# Patient Record
Sex: Female | Born: 2000 | Race: Black or African American | Hispanic: No | Marital: Single | Smoking: Never smoker
Health system: Southern US, Community
[De-identification: ages and names within clinical notes are randomized; demographics above are authoritative.]

## PROBLEM LIST (undated history)

## (undated) ENCOUNTER — Ambulatory Visit (HOSPITAL_COMMUNITY): Admission: EM | Payer: Medicaid Other

## (undated) DIAGNOSIS — Z789 Other specified health status: Secondary | ICD-10-CM

## (undated) HISTORY — PX: NO PAST SURGERIES: SHX2092

---
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
## 2022-02-07 ENCOUNTER — Inpatient Hospital Stay (HOSPITAL_COMMUNITY): Payer: Medicaid Other

## 2022-02-07 ENCOUNTER — Encounter (HOSPITAL_COMMUNITY): Payer: Self-pay

## 2022-02-07 ENCOUNTER — Other Ambulatory Visit: Payer: Self-pay

## 2022-02-07 ENCOUNTER — Inpatient Hospital Stay (HOSPITAL_COMMUNITY)
Admission: AD | Admit: 2022-02-07 | Discharge: 2022-02-07 | Disposition: A | Discharge status: Home / Self Care | Payer: Medicaid Other | Attending: Obstetrics & Gynecology | Admitting: Obstetrics & Gynecology

## 2022-02-07 DIAGNOSIS — R112 Nausea with vomiting, unspecified: Secondary | ICD-10-CM

## 2022-02-07 DIAGNOSIS — Z3A08 8 weeks gestation of pregnancy: Secondary | ICD-10-CM | POA: Diagnosis not present

## 2022-02-07 DIAGNOSIS — O208 Other hemorrhage in early pregnancy: Secondary | ICD-10-CM | POA: Diagnosis not present

## 2022-02-07 DIAGNOSIS — R109 Unspecified abdominal pain: Secondary | ICD-10-CM | POA: Insufficient documentation

## 2022-02-07 DIAGNOSIS — O219 Vomiting of pregnancy, unspecified: Secondary | ICD-10-CM | POA: Diagnosis present

## 2022-02-07 DIAGNOSIS — B999 Unspecified infectious disease: Secondary | ICD-10-CM | POA: Diagnosis not present

## 2022-02-07 DIAGNOSIS — O26891 Other specified pregnancy related conditions, first trimester: Secondary | ICD-10-CM | POA: Diagnosis not present

## 2022-02-07 DIAGNOSIS — Z3491 Encounter for supervision of normal pregnancy, unspecified, first trimester: Secondary | ICD-10-CM

## 2022-02-07 DIAGNOSIS — O2341 Unspecified infection of urinary tract in pregnancy, first trimester: Secondary | ICD-10-CM | POA: Diagnosis not present

## 2022-02-07 DIAGNOSIS — O418X10 Other specified disorders of amniotic fluid and membranes, first trimester, not applicable or unspecified: Secondary | ICD-10-CM

## 2022-02-07 LAB — URINALYSIS, ROUTINE W REFLEX MICROSCOPIC
Bilirubin Urine: NEGATIVE
Glucose, UA: NEGATIVE mg/dL
Hgb urine dipstick: NEGATIVE
Ketones, ur: NEGATIVE mg/dL
Leukocytes,Ua: NEGATIVE
Nitrite: POSITIVE — AB
Protein, ur: NEGATIVE mg/dL
Specific Gravity, Urine: 1.017 (ref 1.005–1.030)
pH: 8 (ref 5.0–8.0)

## 2022-02-07 LAB — CBC
HCT: 37 % (ref 36.0–46.0)
Hemoglobin: 12 g/dL (ref 12.0–15.0)
MCH: 30.2 pg (ref 26.0–34.0)
MCHC: 32.4 g/dL (ref 30.0–36.0)
MCV: 93.2 fL (ref 80.0–100.0)
Platelets: 298 10*3/uL (ref 150–400)
RBC: 3.97 MIL/uL (ref 3.87–5.11)
RDW: 12 % (ref 11.5–15.5)
WBC: 8.4 10*3/uL (ref 4.0–10.5)
nRBC: 0 % (ref 0.0–0.2)

## 2022-02-07 LAB — POCT PREGNANCY, URINE: Preg Test, Ur: POSITIVE — AB

## 2022-02-07 LAB — WET PREP, GENITAL
Clue Cells Wet Prep HPF POC: NONE SEEN
Sperm: NONE SEEN
Trich, Wet Prep: NONE SEEN
WBC, Wet Prep HPF POC: <10
Yeast Wet Prep HPF POC: NONE SEEN

## 2022-02-07 LAB — ABO/RH: ABO/RH(D): A POS

## 2022-02-07 LAB — HCG, QUANTITATIVE, PREGNANCY: hCG, Beta Chain, Quant, S: 161712 m[IU]/mL — ABNORMAL HIGH

## 2022-02-07 MED ORDER — CEPHALEXIN 500 MG PO CAPS: 500.0000 mg | ORAL_CAPSULE | Freq: Four times a day (QID) | ORAL | 0 refills | Status: DC

## 2022-02-07 NOTE — MAU Provider Note (Signed)
Event Date/Time  First Provider Initiated Contact with Patient 02/07/22 1432    Amy Nguyen , a  21 y.o. G1P0 at [redacted]w[redacted]d presents to MAU after 4 episodes of vomiting today. Patient states she woke up vomiting yellow with "dots of blood". She attempted Ginger Ale with no relief. Has not thrown up since arrival to MAU. She also endorses intermittent lower abdominal cramping and back pain rating pain 5/10. Not currently experiencing pain, but "felt it this morning." Denies vaginal bleeding, painful urination, and abnormal discharge.  History     CSN: [id number]  Arrival date and time: 02/07/22 1322   Event Date/Time   First Provider Initiated Contact with Patient 02/07/22 1432      Chief Complaint  Patient presents with   Emesis    OB History     Gravida  1   Para      Term      Preterm      AB      Living         SAB      IAB      Ectopic      Multiple      Live Births              History reviewed. No pertinent past medical history.  History reviewed. No pertinent surgical history.  Family History  Problem Relation Age of Onset   Diabetes Mother     Social History   Tobacco Use   Smoking status: Never   Smokeless tobacco: Never  Vaping Use   Vaping Use: Some days   Substances: Nicotine, Flavoring  Substance Use Topics   Alcohol use: Never   Drug use: Never    Allergies: No Known Allergies  Medications Prior to Admission  Medication Sig Dispense Refill Last Dose   metroNIDAZOLE (FLAGYL) 500 MG tablet Take 1 tablet (500 mg total) by mouth 2 (two) times daily. 14 tablet 0    nitrofurantoin, macrocrystal-monohydrate, (MACROBID) 100 MG capsule Take 1 capsule (100 mg total) by mouth 2 (two) times daily. 10 capsule 0     Review of Systems  Constitutional:  Negative for chills and fever.  HENT:  Negative for sore throat.   Gastrointestinal:  Positive for abdominal pain, nausea and vomiting. Negative for constipation and diarrhea.   Genitourinary:  Positive for pelvic pain. Negative for difficulty urinating, flank pain, urgency, vaginal bleeding, vaginal discharge and vaginal pain.   Physical Exam   Blood pressure (!) 118/95, pulse 86, temperature 98.4 F (36.9 C), resp. rate 12, last menstrual period 12/10/2021, SpO2 99 %.  Physical Exam Vitals and nursing note reviewed.  Constitutional:      General: She is not in acute distress.    Appearance: She is normal weight.  HENT:     Head: Normocephalic.  Pulmonary:     Effort: Pulmonary effort is normal. No respiratory distress.  Abdominal:     Palpations: Abdomen is soft.     Tenderness: There is abdominal tenderness. There is no right CVA tenderness, left CVA tenderness or guarding.     Comments: Pregnant   Musculoskeletal:     Cervical back: Normal range of motion.  Skin:    General: Skin is warm and dry.  Neurological:     Mental Status: She is alert and oriented to person, place, and time.  Psychiatric:        Mood and Affect: Mood normal.     MAU Course  Procedures Orders Placed  This Encounter  Procedures   Wet prep, genital   US OB LESS THAN 14 WEEKS WITH OB TRANSVAGINAL   US OB LESS THAN 14 WEEKS WITH OB TRANSVAGINAL   Urinalysis, Routine w reflex microscopic Urine, Clean Catch   CBC   hCG, quantitative, pregnancy   Diet NPO time specified   Pregnancy, urine POC   ABO/Rh   Discharge patient Discharge disposition: 01-Home or Self Care; Discharge patient date: 02/07/2022   Results for orders placed or performed during the hospital encounter of 02/07/22 (from the past 24 hour(s))  Pregnancy, urine POC     Status: Abnormal   Collection Time: 02/07/22  2:12 PM  Result Value Ref Range   Preg Test, Ur POSITIVE (A) NEGATIVE  Urinalysis, Routine w reflex microscopic     Status: Abnormal   Collection Time: 02/07/22  2:25 PM  Result Value Ref Range   Color, Urine YELLOW YELLOW   APPearance HAZY (A) CLEAR   Specific Gravity, Urine 1.017 1.005 -  1.030   pH 8.0 5.0 - 8.0   Glucose, UA NEGATIVE NEGATIVE mg/dL   Hgb urine dipstick NEGATIVE NEGATIVE   Bilirubin Urine NEGATIVE NEGATIVE   Ketones, ur NEGATIVE NEGATIVE mg/dL   Protein, ur NEGATIVE NEGATIVE mg/dL   Nitrite POSITIVE (A) NEGATIVE   Leukocytes,Ua NEGATIVE NEGATIVE   RBC / HPF 0-5 0 - 5 RBC/hpf   WBC, UA 6-10 0 - 5 WBC/hpf   Bacteria, UA MANY (A) NONE SEEN   Squamous Epithelial / LPF 0-5 0 - 5   Mucus PRESENT   CBC     Status: None   Collection Time: 02/07/22  3:00 PM  Result Value Ref Range   WBC 8.4 4.0 - 10.5 K/uL   RBC 3.97 3.87 - 5.11 MIL/uL   Hemoglobin 12.0 12.0 - 15.0 g/dL   HCT 38.7 56.4 - 33.2 %   MCV 93.2 80.0 - 100.0 fL   MCH 30.2 26.0 - 34.0 pg   MCHC 32.4 30.0 - 36.0 g/dL   RDW 95.1 88.4 - 16.6 %   Platelets 298 150 - 400 K/uL   nRBC 0.0 0.0 - 0.2 %  ABO/Rh     Status: None   Collection Time: 02/07/22  3:00 PM  Result Value Ref Range   ABO/RH(D) A POS    No rh immune globuloin      NOT A RH IMMUNE GLOBULIN CANDIDATE, PT RH POSITIVE Performed at [facility], [address] [place], [place] [address]   hCG, quantitative, pregnancy     Status: Abnormal   Collection Time: 02/07/22  3:00 PM  Result Value Ref Range   hCG, Beta Chain, Quant, S 161,712 (H) <5 mIU/mL  Wet prep, genital     Status: None   Collection Time: 02/07/22  3:24 PM   Specimen: PATH Cytology Cervicovaginal Ancillary Only  Result Value Ref Range   Yeast Wet Prep HPF POC NONE SEEN NONE SEEN   Trich, Wet Prep NONE SEEN NONE SEEN   Clue Cells Wet Prep HPF POC NONE SEEN NONE SEEN   WBC, Wet Prep HPF POC <10 <10   Sperm NONE SEEN    US OB LESS THAN 14 WEEKS WITH OB TRANSVAGINAL  Result Date: 02/07/2022 CLINICAL DATA:  Abdominal pain. EXAM: OBSTETRIC <14 WK [place] AND TRANSVAGINAL OB US TECHNIQUE: Both transabdominal and transvaginal ultrasound examinations were performed for complete evaluation of the gestation as well as the maternal uterus, adnexal regions, and pelvic  cul-de-sac. Transvaginal technique  was performed to assess early pregnancy. COMPARISON:  None Available. FINDINGS: Intrauterine gestational sac: Single Yolk sac:  Visualized. Embryo:  Visualized. Cardiac Activity: Visualized. Heart Rate: 160 bpm CRL:  15.4 mm   7 w   6 d                  [place] EDC: 09/20/2022 Subchorionic hemorrhage: Small subchorionic hemorrhage identified. The chorion and amnion appear separated which is within normal limits for a gestation this age; however, there is some debris identified in this space. Maternal uterus/adnexae: Bilateral ovaries appear within normal limits. There is a corpus luteum in the left ovary. Trace free fluid in the pelvis. IMPRESSION: 1. Single live intrauterine gestation measuring 7 weeks 6 days by crown-rump length. 2. Small subchorionic hemorrhage. 3. Debris identified within the space between the chorion and amnion. This finding is indeterminate. Short-term follow-up ultrasound suggested to re-evaluate. Electronically Signed   By: Darliss Cheney M.D.   On: 02/07/2022 16:30    MDM UA positive for nitrites, indicative UTI. All other labs normal.  Normal IUP noted ~[redacted]w[redacted]d. Small Subchorionic hemorrhage noted on Ultrasound. Debris noted between Chorion and Amnion. Recommendation for follow-up Ultrasound for re-evaluation.   Assessment and Plan   1. UTI (urinary tract infection) in pregnancy in first trimester   2. [redacted] weeks gestation of pregnancy   3. Abdominal pain during pregnancy in first trimester   4. Mild nausea and vomiting   5. Normal IUP (intrauterine pregnancy) on prenatal ultrasound, first trimester   6. Subchorionic hematoma in first trimester, single or unspecified fetus    - UTI noted on UA. PO Keflex sent to Outpatient pharmacy. Admin instructions provided at bedside.  - Normal IUP with small subchorionic hemorrhage. Bleeding expectations associated with Subchorionic Hem discussed with patient.  - Message sent to [facility] to schedule follow up US for  Debris between Chorion and Amnion.  - Early Pregnancy return precautions discussed. - Patient discharged home in stable condition.  - Patient may return to MAU as needed.   Claudette Head 02/07/2022, 2:32 PM

## 2022-02-07 NOTE — MAU Note (Addendum)
Amy Nguyen is a 21 y.o. at Unknown here in MAU reporting: Pt reports that she has been throwing up throughout her pregnancy. Pt reports she woke up and has been throwing up yellow and some blood with a bad taste. Pt reports feeling light headed.  LMP: 12/10/2021 Onset of complaint: today  Pain score: 5/10 lower abdominal cramping  There were no vitals filed for this visit.    Lab orders placed from triage:  ua

## 2022-02-08 LAB — GC/CHLAMYDIA PROBE AMP (~~LOC~~) NOT AT ARMC
Chlamydia: NEGATIVE
Comment: NEGATIVE
Comment: NORMAL
Neisseria Gonorrhea: NEGATIVE

## 2022-02-11 ENCOUNTER — Telehealth: Payer: Self-pay

## 2022-02-11 DIAGNOSIS — O468X1 Other antepartum hemorrhage, first trimester: Secondary | ICD-10-CM

## 2022-02-11 NOTE — Telephone Encounter (Signed)
-----   Message from Carlynn Herald, [place] sent at 02/07/2022  5:00 PM EDT ----- Regarding: PP Message Hey this patient needs a follow up US. Please call patient to schedule. Thank you!    Called pt with new appt time; partner answers phone and states patient is asleep. Explained I will send MyChart message to patient with details of new appointment.

## 2022-02-21 ENCOUNTER — Ambulatory Visit
Admission: RE | Admit: 2022-02-21 | Discharge: 2022-02-21 | Disposition: A | Discharge status: Home / Self Care | Payer: Medicaid Other | Source: Ambulatory Visit | Attending: Certified Nurse Midwife | Admitting: Certified Nurse Midwife

## 2022-02-21 ENCOUNTER — Encounter (HOSPITAL_COMMUNITY): Payer: Self-pay | Admitting: Emergency Medicine

## 2022-02-21 ENCOUNTER — Ambulatory Visit (HOSPITAL_COMMUNITY)
Admission: EM | Admit: 2022-02-21 | Discharge: 2022-02-21 | Disposition: A | Discharge status: Home / Self Care | Payer: Medicaid Other | Attending: Internal Medicine | Admitting: Internal Medicine

## 2022-02-21 DIAGNOSIS — O418X10 Other specified disorders of amniotic fluid and membranes, first trimester, not applicable or unspecified: Secondary | ICD-10-CM | POA: Insufficient documentation

## 2022-02-21 DIAGNOSIS — Z202 Contact with and (suspected) exposure to infections with a predominantly sexual mode of transmission: Secondary | ICD-10-CM | POA: Diagnosis not present

## 2022-02-21 DIAGNOSIS — O468X1 Other antepartum hemorrhage, first trimester: Secondary | ICD-10-CM | POA: Insufficient documentation

## 2022-02-21 DIAGNOSIS — R21 Rash and other nonspecific skin eruption: Secondary | ICD-10-CM | POA: Insufficient documentation

## 2022-02-21 DIAGNOSIS — Z113 Encounter for screening for infections with a predominantly sexual mode of transmission: Secondary | ICD-10-CM | POA: Insufficient documentation

## 2022-02-21 MED ORDER — CEFTRIAXONE SODIUM 500 MG IJ SOLR
500.0000 mg | Freq: Once | INTRAMUSCULAR | Status: AC
  Administered 2022-02-21: 500 mg via INTRAMUSCULAR

## 2022-02-21 MED ORDER — LIDOCAINE HCL (PF) 1 % IJ SOLN
INTRAMUSCULAR | Status: AC
  Filled 2022-02-21: qty 2

## 2022-02-21 MED ORDER — NYSTATIN 100000 UNIT/GM EX CREA: TOPICAL_CREAM | CUTANEOUS | 0 refills | Status: DC

## 2022-02-21 MED ORDER — CEFTRIAXONE SODIUM 500 MG IJ SOLR
INTRAMUSCULAR | Status: AC
  Filled 2022-02-21: qty 500

## 2022-02-21 NOTE — Discharge Instructions (Signed)
Apply nystatin cream to your genital rash 2-3 times daily until the rash improves and goes away (likely approximately 7 days).  We gave you treatment for gonorrhea today due to your positive exposure.  The rest of your STI testing will come back in the next 2 to 3 days.  We will give you a phone call if you are any of your results are positive requiring further treatment.  Avoid sexual intercourse for the next week to prevent spread of STI.  Use condoms to further prevent spread of STIs in the future.   Follow-up with your OB/GYN as directed for further pregnancy care.  If you develop any new or worsening symptoms or do not improve in the next 2 to 3 days, please return.  If your symptoms are severe, please go to the emergency room.  Follow-up with your primary care provider for further evaluation and management of your symptoms as well as ongoing wellness visits.  I hope you feel better!

## 2022-02-21 NOTE — ED Triage Notes (Signed)
Patient c/o rash that started today.   Patient endorses rash is present on groin area.   Patient denies itching, burning, or pain.   Patient denies dysuria   Patient is [redacted] weeks pregnant.

## 2022-02-21 NOTE — ED Provider Notes (Signed)
[facility]    CSN: [id number] Arrival date & time: 02/21/22  1731      History   Chief Complaint Chief Complaint  Patient presents with   Rash    HPI Amy Nguyen is a 21 y.o. female.   Patient presents to urgent care for evaluation of her vaginal rash that she first noticed a couple of days ago when she woke up.  Her boyfriend recently tested positive for gonorrhea.  She had sexual intercourse with him 2 days ago and he found out that he had gonorrhea yesterday.  She denies vaginal odor and vaginal itching.  She does report a small amount of clear/white vaginal discharge.  She is currently [redacted] weeks pregnant and has had her first OB/GYN appointment.  She was tested for STIs 2 weeks ago and tested negative for everything.  She was diagnosed with a urinary tract infection when she was tested for STIs 2 weeks ago and treated with antibiotics successfully.  She denies urinary frequency, dysuria, and urgency at this time.  No fever/chills reported.  No abdominal pain, diarrhea, constipation, dizziness, weakness, vaginal bleeding, or low back pain.    Rash   History reviewed. No pertinent past medical history.  There are no problems to display for this patient.   History reviewed. No pertinent surgical history.  OB History     Gravida  1   Para      Term      Preterm      AB      Living         SAB      IAB      Ectopic      Multiple      Live Births               Home Medications    Prior to Admission medications   Medication Sig Start Date End Date Taking? Authorizing Provider  nystatin cream (MYCOSTATIN) Apply to affected area 2 times daily 02/21/22  Yes Carlisle Beers, FNP  esomeprazole (NEXIUM) 20 MG capsule Take 1 capsule (20 mg total) by mouth daily before breakfast. Patient not taking: No sig reported 06/23/19 01/09/21  Wallis Bamberg, PA-C  famotidine (PEPCID) 20 MG tablet Take 1 tablet (20 mg total) by mouth 2 (two) times  daily. Patient not taking: No sig reported 06/23/19 01/09/21  Wallis Bamberg, PA-C    Family History Family History  Problem Relation Age of Onset   Diabetes Mother     Social History Social History   Tobacco Use   Smoking status: Never   Smokeless tobacco: Never  Vaping Use   Vaping Use: Some days   Substances: Nicotine, Flavoring  Substance Use Topics   Alcohol use: Never   Drug use: Never     Allergies   Patient has no known allergies.   Review of Systems Review of Systems  Skin:  Positive for rash.  Per HPI   Physical Exam Triage Vital Signs ED Triage Vitals  Enc Vitals Group     BP 02/21/22 1818 115/75     Pulse Rate 02/21/22 1818 82     Resp 02/21/22 1818 18     Temp 02/21/22 1818 98.8 F (37.1 C)     Temp Source 02/21/22 1818 Oral     SpO2 02/21/22 1818 100 %     Weight --      Height --      Head Circumference --  Peak Flow --      Pain Score 02/21/22 1821 0     Pain Loc --      Pain Edu? --      Excl. in [place]? --    No data found.  Updated Vital Signs BP 115/75 (BP Location: Right Arm)   Pulse 82   Temp 98.8 F (37.1 C) (Oral)   Resp 18   LMP 12/10/2021   SpO2 100%   Visual Acuity Right Eye Distance:   Left Eye Distance:   Bilateral Distance:    Right Eye Near:   Left Eye Near:    Bilateral Near:     Physical Exam Vitals and nursing note reviewed.  Constitutional:      Appearance: Normal appearance. She is not ill-appearing or toxic-appearing.     Comments: Very pleasant patient sitting on exam in position of comfort table in no acute distress.   HENT:     Head: Normocephalic and atraumatic.     Right Ear: Hearing and external ear normal.     Left Ear: Hearing and external ear normal.     Nose: Nose normal.     Mouth/Throat:     Lips: Pink.     Mouth: Mucous membranes are moist.  Eyes:     General: Lids are normal. Vision grossly intact. Gaze aligned appropriately.     Extraocular Movements: Extraocular movements intact.      Conjunctiva/sclera: Conjunctivae normal.  Pulmonary:     Effort: Pulmonary effort is normal.  Abdominal:     Palpations: Abdomen is soft.  Genitourinary:    Pubic Area: Rash present.     Comments: Maculupapular skin colored rash that is slightly erythematous inferior to the vagina that extends slightly to the rectum and into the gluteal cleft that appears to be fungal in nature.  Patient denies pain and itching to the rash.  No drainage present. No vaginal discharge noted with inspection of external vaginal canal. Labia majora and minora appear normal and are not swollen. No lesions to the labia majora and minora/vaginal canal. No blood visualized.  Musculoskeletal:     Cervical back: Neck supple.  Skin:    General: Skin is warm and dry.     Capillary Refill: Capillary refill takes less than 2 seconds.     Findings: No rash.  Neurological:     General: No focal deficit present.     Mental Status: She is alert and oriented to person, place, and time. Mental status is at baseline.     Cranial Nerves: No dysarthria or facial asymmetry.     Gait: Gait is intact.  Psychiatric:        Mood and Affect: Mood normal.        Speech: Speech normal.        Behavior: Behavior normal.        Thought Content: Thought content normal.        Judgment: Judgment normal.      UC Treatments / Results  Labs (all labs ordered are listed, but only abnormal results are displayed) Labs Reviewed - No data to display  EKG   Radiology No results found.  Procedures Procedures (including critical care time)  Medications Ordered in UC Medications  cefTRIAXone (ROCEPHIN) injection 500 mg (500 mg Intramuscular Given 02/21/22 1940)    Initial Impression / Assessment and Plan / UC Course  I have reviewed the triage vital signs and the nursing notes.  Pertinent labs & imaging results that  were available during my care of the patient were reviewed by me and considered in my medical decision making  (see chart for details).  1.  Exposure to gonorrhea Ceftriaxone 500 mg injection given in clinic due to known exposure to gonorrhea.  STI testing is pending.  Plan to treat per protocol for other infections present.  Patient to abstain from sexual intercourse for 1 week to avoid spread of STI.  Encouraged condom use to prevent future spread of STIs.  2.  Rash of genital area Rash appears fungal in nature.  Patient to apply nystatin cream twice daily until the rash resolves (likely 7 days).  This is safe in pregnancy.  Patient to follow-up with OB/GYN for further evaluation and management of genital rash as well as all other pregnancy related concerns.  She is without abdominal pain and vaginal bleeding at this time.  Discussed physical exam and available lab work findings in clinic with patient.  Counseled patient regarding appropriate use of medications and potential side effects for all medications recommended or prescribed today. Discussed red flag signs and symptoms of worsening condition,when to call the PCP office, return to urgent care, and when to seek higher level of care in the emergency department. Patient verbalizes understanding and agreement with plan. All questions answered. Patient discharged in stable condition.  Final Clinical Impressions(s) / UC Diagnoses   Final diagnoses:  Exposure to gonorrhea  Screening examination for sexually transmitted disease  Rash of genital area     Discharge Instructions      Apply nystatin cream to your genital rash 2-3 times daily until the rash improves and goes away (likely approximately 7 days).  We gave you treatment for gonorrhea today due to your positive exposure.  The rest of your STI testing will come back in the next 2 to 3 days.  We will give you a phone call if you are any of your results are positive requiring further treatment.  Avoid sexual intercourse for the next week to prevent spread of STI.  Use condoms to further prevent  spread of STIs in the future.   Follow-up with your OB/GYN as directed for further pregnancy care.  If you develop any new or worsening symptoms or do not improve in the next 2 to 3 days, please return.  If your symptoms are severe, please go to the emergency room.  Follow-up with your primary care provider for further evaluation and management of your symptoms as well as ongoing wellness visits.  I hope you feel better!     ED Prescriptions     Medication Sig Dispense Auth. Provider   nystatin cream (MYCOSTATIN) Apply to affected area 2 times daily 30 g Carlisle Beers, FNP      PDMP not reviewed this encounter.   Carlisle Beers, [place] 02/24/22 2029

## 2022-02-22 ENCOUNTER — Telehealth (HOSPITAL_COMMUNITY): Payer: Self-pay | Admitting: Emergency Medicine

## 2022-02-22 LAB — CERVICOVAGINAL ANCILLARY ONLY
Bacterial Vaginitis (gardnerella): NEGATIVE
Candida Glabrata: NEGATIVE
Candida Vaginitis: POSITIVE — AB
Chlamydia: NEGATIVE
Comment: NEGATIVE
Comment: NEGATIVE
Comment: NEGATIVE
Comment: NEGATIVE
Comment: NEGATIVE
Comment: NORMAL
Neisseria Gonorrhea: NEGATIVE
Trichomonas: NEGATIVE

## 2022-02-22 MED ORDER — CLOTRIMAZOLE 1 % VA CREA: 1.0000 | TOPICAL_CREAM | Freq: Every day | VAGINAL | 0 refills | Status: DC

## 2022-02-28 ENCOUNTER — Encounter (HOSPITAL_BASED_OUTPATIENT_CLINIC_OR_DEPARTMENT_OTHER): Payer: Medicaid Other

## 2022-02-28 ENCOUNTER — Encounter (HOSPITAL_BASED_OUTPATIENT_CLINIC_OR_DEPARTMENT_OTHER): Payer: Medicaid Other | Admitting: Obstetrics & Gynecology

## 2022-02-28 ENCOUNTER — Telehealth (HOSPITAL_BASED_OUTPATIENT_CLINIC_OR_DEPARTMENT_OTHER): Payer: Self-pay | Admitting: *Deleted

## 2022-02-28 NOTE — Telephone Encounter (Signed)
Called pt to let her know that she missed her new OB appt today. Pt states that she had a situation where her tire was flat and she would be unable to make it. Pt rescheduled for next available appt on 03/26/22.

## 2022-03-01 ENCOUNTER — Encounter (HOSPITAL_BASED_OUTPATIENT_CLINIC_OR_DEPARTMENT_OTHER): Payer: Medicaid Other | Admitting: Medical

## 2022-03-09 ENCOUNTER — Ambulatory Visit (HOSPITAL_COMMUNITY)
Admission: EM | Admit: 2022-03-09 | Discharge: 2022-03-09 | Disposition: A | Discharge status: Home / Self Care | Payer: Medicaid Other | Attending: Emergency Medicine | Admitting: Emergency Medicine

## 2022-03-09 ENCOUNTER — Encounter (HOSPITAL_COMMUNITY): Payer: Self-pay | Admitting: Emergency Medicine

## 2022-03-09 DIAGNOSIS — L02412 Cutaneous abscess of left axilla: Secondary | ICD-10-CM | POA: Diagnosis not present

## 2022-03-09 MED ORDER — CEPHALEXIN 500 MG PO CAPS: 500.0000 mg | ORAL_CAPSULE | Freq: Two times a day (BID) | ORAL | 0 refills | Status: AC

## 2022-03-09 NOTE — Discharge Instructions (Addendum)
Please take medication as prescribed. Avoid shaving the area for the next two weeks. You can apply warm compress to help with pain, swelling, and drainage. You can take tylenol for pain, but avoid any medicines like ibuprofen/Advil, Aleve, naproxen as these are not safe in early pregnancy.  Follow up with your OB/Gyn.  Please go to the emergency department if symptoms worsen.

## 2022-03-09 NOTE — ED Triage Notes (Signed)
Pt reports bump/knot in left axilla that has gotten more painful over the past week after shaving arm pit area. Reports when woke up today was draining so continued to squeeze and get greenish fluid out. Reports has another area that is hard and painful

## 2022-03-09 NOTE — ED Provider Notes (Signed)
[facility]    CSN: [id number] Arrival date & time: 03/09/22  1316     History   Chief Complaint Chief Complaint  Patient presents with   Abscess    HPI Amy Nguyen is a 21 y.o. female.  Presents with 4-day history of lump in the left armpit.  Reports it opened and started draining yesterday.  A new lump seems to be appearing just underneath it.  She has no history of abscess in this area.  Originally thought it was from shaving. No fever/chills, warmth, redness, abd pain, vomiting/diarrhea. Applying warm compress.  Currently [redacted] weeks pregnant.  History reviewed. No pertinent past medical history.  There are no problems to display for this patient.  History reviewed. No pertinent surgical history.  OB History     Gravida  1   Para      Term      Preterm      AB      Living         SAB      IAB      Ectopic      Multiple      Live Births               Home Medications    Prior to Admission medications   Medication Sig Start Date End Date Taking? Authorizing Provider  cephALEXin (KEFLEX) 500 MG capsule Take 1 capsule (500 mg total) by mouth 2 (two) times daily for 5 days. 03/09/22 03/14/22 Yes Elmina Hendel, Lurena Joiner, PA-C  esomeprazole (NEXIUM) 20 MG capsule Take 1 capsule (20 mg total) by mouth daily before breakfast. Patient not taking: No sig reported 06/23/19 01/09/21  Wallis Bamberg, PA-C  famotidine (PEPCID) 20 MG tablet Take 1 tablet (20 mg total) by mouth 2 (two) times daily. Patient not taking: No sig reported 06/23/19 01/09/21  Wallis Bamberg, PA-C    Family History Family History  Problem Relation Age of Onset   Diabetes Mother     Social History Social History   Tobacco Use   Smoking status: Never   Smokeless tobacco: Never  Vaping Use   Vaping Use: Some days   Substances: Nicotine, Flavoring  Substance Use Topics   Alcohol use: Never   Drug use: Never     Allergies   Patient has no known allergies.   Review of  Systems Review of Systems Per HPI  Physical Exam Triage Vital Signs ED Triage Vitals  Enc Vitals Group     BP 03/09/22 1341 120/68     Pulse Rate 03/09/22 1341 92     Resp 03/09/22 1344 16     Temp 03/09/22 1341 98.7 F (37.1 C)     Temp Source 03/09/22 1341 Oral     SpO2 03/09/22 1341 96 %     Weight --      Height --      Head Circumference --      Peak Flow --      Pain Score 03/09/22 1339 6     Pain Loc --      Pain Edu? --      Excl. in [place]? --    No data found.  Updated Vital Signs BP 120/68 (BP Location: Right Arm)   Pulse 92   Temp 98.7 F (37.1 C) (Oral)   Resp 16   LMP 12/10/2021   SpO2 96%    Physical Exam Vitals and nursing note reviewed.  Constitutional:  General: She is not in acute distress. HENT:     Mouth/Throat:     Pharynx: Oropharynx is clear.  Eyes:     Conjunctiva/sclera: Conjunctivae normal.  Cardiovascular:     Rate and Rhythm: Normal rate and regular rhythm.     Pulses: Normal pulses.     Heart sounds: Normal heart sounds.  Pulmonary:     Effort: Pulmonary effort is normal.     Breath sounds: Normal breath sounds.  Musculoskeletal:        General: Normal range of motion.  Skin:    Findings: Abscess present.     Comments: Half centimeter, draining area of the left armpit.  Very small firm area below it.  No erythema or warmth  Neurological:     Mental Status: She is alert and oriented to person, place, and time.     UC Treatments / Results  Labs (all labs ordered are listed, but only abnormal results are displayed) Labs Reviewed - No data to display  EKG   Radiology No results found.  Procedures Procedures  Medications Ordered in UC Medications - No data to display  Initial Impression / Assessment and Plan / UC Course  I have reviewed the triage vital signs and the nursing notes.  Pertinent labs & imaging results that were available during my care of the patient were reviewed by me and considered in my  medical decision making (see chart for details).  One area draining, other area firm and not ready to be drained. Keflex twice daily for 5 days.  Follow-up with OB/GYN.  Warm compress, Tylenol for any pain.  Avoid shaving the area.  Patient agrees to plan and she is discharged stable condition.  Final Clinical Impressions(s) / UC Diagnoses   Final diagnoses:  Abscess of left axilla     Discharge Instructions      Please take medication as prescribed. Avoid shaving the area for the next two weeks. You can apply warm compress to help with pain, swelling, and drainage. You can take tylenol for pain, but avoid any medicines like ibuprofen/Advil, Aleve, naproxen as these are not safe in early pregnancy.  Follow up with your OB/Gyn.  Please go to the emergency department if symptoms worsen.    ED Prescriptions     Medication Sig Dispense Auth. Provider   cephALEXin (KEFLEX) 500 MG capsule Take 1 capsule (500 mg total) by mouth 2 (two) times daily for 5 days. 10 capsule Niara Bunker, Lurena Joiner, PA-C      PDMP not reviewed this encounter.   Antion Andres, [place] 03/09/22 1444

## 2022-03-26 ENCOUNTER — Telehealth (HOSPITAL_BASED_OUTPATIENT_CLINIC_OR_DEPARTMENT_OTHER): Payer: Self-pay | Admitting: Neonatal-Perinatal Medicine

## 2022-03-26 ENCOUNTER — Encounter (HOSPITAL_BASED_OUTPATIENT_CLINIC_OR_DEPARTMENT_OTHER): Payer: Medicaid Other | Admitting: Obstetrics & Gynecology

## 2022-03-26 NOTE — Telephone Encounter (Signed)
Called patient and was not able to leave a message no voice mail was step up.

## 2022-04-01 ENCOUNTER — Inpatient Hospital Stay (HOSPITAL_COMMUNITY)
Admission: AD | Admit: 2022-04-01 | Discharge: 2022-04-01 | Disposition: A | Discharge status: Home / Self Care | Payer: Medicaid Other | Attending: Family Medicine | Admitting: Family Medicine

## 2022-04-01 ENCOUNTER — Encounter (HOSPITAL_COMMUNITY): Payer: Self-pay | Admitting: Family Medicine

## 2022-04-01 ENCOUNTER — Other Ambulatory Visit: Payer: Self-pay

## 2022-04-01 DIAGNOSIS — L02416 Cutaneous abscess of left lower limb: Secondary | ICD-10-CM | POA: Diagnosis not present

## 2022-04-01 DIAGNOSIS — Z3A16 16 weeks gestation of pregnancy: Secondary | ICD-10-CM | POA: Diagnosis not present

## 2022-04-01 DIAGNOSIS — O208 Other hemorrhage in early pregnancy: Secondary | ICD-10-CM | POA: Diagnosis not present

## 2022-04-01 DIAGNOSIS — L02411 Cutaneous abscess of right axilla: Secondary | ICD-10-CM | POA: Diagnosis not present

## 2022-04-01 DIAGNOSIS — O26892 Other specified pregnancy related conditions, second trimester: Secondary | ICD-10-CM | POA: Insufficient documentation

## 2022-04-01 DIAGNOSIS — O418X20 Other specified disorders of amniotic fluid and membranes, second trimester, not applicable or unspecified: Secondary | ICD-10-CM

## 2022-04-01 DIAGNOSIS — L02412 Cutaneous abscess of left axilla: Secondary | ICD-10-CM | POA: Insufficient documentation

## 2022-04-01 DIAGNOSIS — R109 Unspecified abdominal pain: Secondary | ICD-10-CM | POA: Diagnosis present

## 2022-04-01 DIAGNOSIS — L0291 Cutaneous abscess, unspecified: Secondary | ICD-10-CM

## 2022-04-01 HISTORY — DX: Other specified health status: Z78.9

## 2022-04-01 MED ORDER — SULFAMETHOXAZOLE-TRIMETHOPRIM 800-160 MG PO TABS: 1.0000 | ORAL_TABLET | Freq: Two times a day (BID) | ORAL | 1 refills | Status: DC

## 2022-04-01 NOTE — MAU Note (Signed)
Amy Nguyen is a 21 y.o. at [redacted]w[redacted]d here in MAU reporting: abdominal cramping and intermittent spotting.  Reports not spotting currently.  Denies recent intercourse. Took Tylenol @ 0900, no relief noted.  Denies LOF or VB. Also c/o lumps underneath bilateral armpits and inner thighs.  States seen in UC for lumps, given antibiotics, but lumps have remained.   Onset of complaint: today Pain score: 7/10 Vitals:   04/01/22 1611  BP: 110/66  Pulse: (!) 110  Resp: 18  Temp: 98.5 F (36.9 C)  SpO2: 100%     FHT:154 bpm Lab orders placed from triage:   UA

## 2022-04-01 NOTE — MAU Provider Note (Signed)
History     CSN: [id number]  Arrival date and time: 04/01/22 1551   Event Date/Time   First Provider Initiated Contact with Patient 04/01/22 1717      Chief Complaint  Patient presents with   Abdominal Pain   HPI This is a 21 year old G1, P0 at 16 weeks and 0 days who presents with painful lumps in her axilla and on her medial thighs.  She has been having these off-and-on for the past few weeks.  When she develops this, the area swelled up and moving first drain purulent fluid.  No palliating or provoking factors.  She was seen at the urgent care couple weeks ago and was treated with Keflex, which did not help.  Initially, his lip started to hurt axilla bilaterally.  It is only been recently that she has developed on the thighs.  Patient also having some mild cramping with spotting.  She has had this before in June.  OB History     Gravida  1   Para      Term      Preterm      AB      Living         SAB      IAB      Ectopic      Multiple      Live Births              Past Medical History:  Diagnosis Date   Medical history non-contributory     Past Surgical History:  Procedure Laterality Date   NO PAST SURGERIES      Family History  Problem Relation Age of Onset   Diabetes Mother    Hypertension Father     Social History   Tobacco Use   Smoking status: Never   Smokeless tobacco: Never  Vaping Use   Vaping Use: Some days   Substances: Nicotine, Flavoring  Substance Use Topics   Alcohol use: Never   Drug use: Never    Allergies: No Known Allergies  Medications Prior to Admission  Medication Sig Dispense Refill Last Dose   Prenatal Vit-Fe Fumarate-FA (PRENATAL MULTIVITAMIN) TABS tablet Take 1 tablet by mouth daily at 12 noon.   03/31/2022    Review of Systems Physical Exam   Blood pressure 116/65, pulse (!) 104, temperature 98.5 F (36.9 C), temperature source Oral, resp. rate 18, height 5' 7.5" (1.715 m), weight 78.2 kg, last  menstrual period 12/10/2021, SpO2 100 %.  Physical Exam Vitals reviewed.  Constitutional:      Appearance: She is well-developed.  Skin:    Comments: 3 cm firm and tender abscess in the right axilla 2 cm firm and tender abscess in the left axilla 4 cm firm and tender abscess on the left medial thigh  Neurological:     Mental Status: She is alert.     MAU Course  Procedures Pt informed that the ultrasound is considered a limited OB ultrasound and is not intended to be a complete ultrasound exam.  Patient also informed that the ultrasound is not being completed with the intent of assessing for fetal or placental anomalies or any pelvic abnormalities.  Explained that the purpose of today's ultrasound is to assess for  viability.  Patient acknowledges the purpose of the exam and the limitations of the study.    Intrauterine pregnancy with fetal heart rate 140s.  Normal fluid.  Small subchorionic hematoma.  Assessment and Plan   1. [redacted] weeks gestation  of pregnancy   2. Cutaneous abscess, unspecified site   3. Subchorionic hematoma in second trimester, single or unspecified fetus    Will cover for MRSA with Bactrim twice a day for the next 7 days.  At this point there is nothing fluctuant to drain.   Could consider the MRSA decolonization with Hibiclens and Bactroban following treatment.  Levie Heritage 04/01/2022, 5:17 PM

## 2022-04-10 ENCOUNTER — Telehealth (INDEPENDENT_AMBULATORY_CARE_PROVIDER_SITE_OTHER): Payer: Medicaid Other

## 2022-04-10 DIAGNOSIS — Z3402 Encounter for supervision of normal first pregnancy, second trimester: Secondary | ICD-10-CM | POA: Insufficient documentation

## 2022-04-10 DIAGNOSIS — Z3480 Encounter for supervision of other normal pregnancy, unspecified trimester: Secondary | ICD-10-CM

## 2022-04-10 MED ORDER — BLOOD PRESSURE MONITORING DEVI: 1.0000 | 0 refills | Status: DC

## 2022-04-10 NOTE — Progress Notes (Signed)
New OB Intake  I connected with  Amy Nguyen on 04/10/22 at  2:15 PM EDT by MyChart Video Visit and verified that I am speaking with the correct person using two identifiers. Nurse is located at [facility] and pt is located at [facility].  I discussed the limitations, risks, security and privacy concerns of performing an evaluation and management service by telephone and the availability of in person appointments. I also discussed with the patient that there may be a patient responsible charge related to this service. The patient expressed understanding and agreed to proceed.  I explained I am completing New OB Intake today. We discussed her EDD of 09/16/22 that is based on LMP of 12/10/21. Pt is G1/P0. I reviewed her allergies, medications, Medical/Surgical/OB history, and appropriate screenings. I informed her of [facility] services. [facility] information placed in AVS. Based on history, this is a/an  pregnancy uncomplicated .   There are no problems to display for this patient.   Concerns addressed today  Delivery Plans Plans to deliver at [facility]. Patient given information for [facility] Healthy Baby website for more information about [facility]. Patient is not interested in water birth. Offered upcoming OB visit with CNM to discuss further.  MyChart/Babyscripts MyChart access verified. I explained pt will have some visits in office and some virtually. Babyscripts instructions given and order placed. Patient verifies receipt of registration text/e-mail. Account successfully created and app downloaded.  Blood Pressure Cuff/Weight Scale Blood pressure cuff ordered for patient to pick-up from [employer]. Explained after first prenatal appt pt will check weekly and document in Babyscripts. Patient does / does not  have weight scale. Weight scale ordered for patient to pick up from [employer].   Anatomy US Explained first scheduled [place] will be around 19 weeks. Anatomy US scheduled for  05/03/22 at 0145. Pt notified to arrive at 0130.  Labs Discussed Avelina Laine genetic screening with patient. Would like both Panorama and Horizon drawn at new OB visit. Routine prenatal labs needed.  Covid Vaccine Patient has not covid vaccine.   Is patient a CenteringPregnancy candidate?    Declined due to [facility] Not a candidate due to NA Centering Patient" indicated on sticky note   Is patient a Mom+Baby Combined Care candidate?  Accepted    Scheduled with Mom+Baby provider   Social Determinants of Health Food Insecurity: Patient denies food insecurity. WIC Referral: Patient is interested in referral to [facility].  Transportation: Patient denies transportation needs. Childcare: Discussed no children allowed at ultrasound appointments. Offered childcare services; patient declines childcare services at this time.  First visit review I reviewed new OB appt with pt. I explained she will have a provider visit that includes . Explained pt will be seen by Dr. Alvester Morin at first visit; encounter routed to appropriate provider. Explained that patient will be seen by pregnancy navigator following visit with provider.   Henrietta Dine, CMA 04/10/2022  2:22 PM

## 2022-04-10 NOTE — Patient Instructions (Signed)

## 2022-04-22 ENCOUNTER — Ambulatory Visit (INDEPENDENT_AMBULATORY_CARE_PROVIDER_SITE_OTHER): Payer: Medicaid Other | Admitting: Family Medicine

## 2022-04-22 ENCOUNTER — Other Ambulatory Visit (HOSPITAL_COMMUNITY)
Admission: RE | Admit: 2022-04-22 | Discharge: 2022-04-22 | Disposition: A | Discharge status: Home / Self Care | Payer: Medicaid Other | Source: Ambulatory Visit | Attending: Family Medicine | Admitting: Family Medicine

## 2022-04-22 ENCOUNTER — Encounter: Payer: Self-pay | Admitting: *Deleted

## 2022-04-22 ENCOUNTER — Encounter: Payer: Self-pay | Admitting: Family Medicine

## 2022-04-22 VITALS — BP 110/75 | HR 96 | Wt 169.4 lb

## 2022-04-22 DIAGNOSIS — Z3402 Encounter for supervision of normal first pregnancy, second trimester: Secondary | ICD-10-CM

## 2022-04-22 DIAGNOSIS — O219 Vomiting of pregnancy, unspecified: Secondary | ICD-10-CM

## 2022-04-22 DIAGNOSIS — R8271 Bacteriuria: Secondary | ICD-10-CM

## 2022-04-22 LAB — OB RESULTS CONSOLE GBS: GBS: POSITIVE

## 2022-04-22 MED ORDER — ONDANSETRON 4 MG PO TBDP: 4.0000 mg | ORAL_TABLET | Freq: Four times a day (QID) | ORAL | 0 refills | Status: DC | PRN

## 2022-04-22 MED ORDER — METOCLOPRAMIDE HCL 5 MG PO TABS: 5.0000 mg | ORAL_TABLET | Freq: Three times a day (TID) | ORAL | 1 refills | Status: DC

## 2022-04-22 MED ORDER — PROMETHAZINE HCL 25 MG PO TABS: 25.0000 mg | ORAL_TABLET | Freq: Four times a day (QID) | ORAL | 1 refills | Status: DC | PRN

## 2022-04-22 NOTE — Patient Instructions (Signed)
  You will take your Reglan every day-- three times per day to help prevent vomiting.  Next you will take the Phenergan if you are having vomiting  Then if that is not working you will use the Zofran because this is dissolvable tablet

## 2022-04-22 NOTE — Progress Notes (Signed)
INITIAL PRENATAL VISIT  Subjective:   Amy Nguyen is being seen today for her first obstetrical visit.  This is a planned pregnancy. This is a desired pregnancy.  She is at [redacted]w[redacted]d gestation by LMP.  Her obstetrical history is significant for  no RF . Relationship with FOB: significant other, living together. Patient does intend to breast feed. Pregnancy history fully reviewed.  Patient reports nausea and vomiting. She vomits about 3-4 times per day. Mostly in AM. Has tried ginger ale and saltines.   Indications for ASA therapy (per uptodate) One of the following: Previous pregnancy with preeclampsia, especially early onset and with an adverse outcome No Multifetal gestation No Chronic hypertension No Type 1 or 2 diabetes mellitus No Chronic kidney disease No Autoimmune disease (antiphospholipid syndrome, systemic lupus erythematosus) No  Two or more of the following: Nulliparity Yes Obesity (body mass index >30 kg/m2) No Family history of preeclampsia in mother or sister No Age ?35 years No Sociodemographic characteristics (African American race, low socioeconomic level) Yes Personal risk factors (eg, previous pregnancy with low birth weight or small for gestational age infant, previous adverse pregnancy outcome [eg, stillbirth], interval >10 years between pregnancies) No   Early screening tests: FBS, A1C, Random CBG, glucose challenge   Review of Systems:   Review of Systems  Objective:    Obstetric History OB History  Gravida Para Term Preterm AB Living  1            SAB IAB Ectopic Multiple Live Births               # Outcome Date GA Lbr Len/2nd Weight Sex Delivery Anes PTL Lv  1 Current             Past Medical History:  Diagnosis Date   Medical history non-contributory     Past Surgical History:  Procedure Laterality Date   NO PAST SURGERIES      Current Outpatient Medications on File Prior to Visit  Medication Sig Dispense Refill   Prenatal  Vit-Fe Fumarate-FA (PRENATAL MULTIVITAMIN) TABS tablet Take 1 tablet by mouth daily at 12 noon.     [DISCONTINUED] esomeprazole (NEXIUM) 20 MG capsule Take 1 capsule (20 mg total) by mouth daily before breakfast. (Patient not taking: No sig reported) 30 capsule 0   [DISCONTINUED] famotidine (PEPCID) 20 MG tablet Take 1 tablet (20 mg total) by mouth 2 (two) times daily. (Patient not taking: No sig reported) 60 tablet 0   No current facility-administered medications on file prior to visit.    No Known Allergies  Social History:  reports that she has never smoked. She has never used smokeless tobacco. She reports that she does not drink alcohol and does not use drugs.  Family History  Problem Relation Age of Onset   Diabetes Mother    Hypertension Father     The following portions of the patient's history were reviewed and updated as appropriate: allergies, current medications, past family history, past medical history, past social history, past surgical history and problem list.  Review of Systems Review of Systems  Constitutional:  Negative for chills and fever.  HENT:  Negative for congestion and sore throat.   Eyes:  Negative for pain and visual disturbance.  Respiratory:  Negative for cough, chest tightness and shortness of breath.   Cardiovascular:  Negative for chest pain.  Gastrointestinal:  Negative for abdominal pain, diarrhea, nausea and vomiting.  Endocrine: Negative for cold intolerance and heat intolerance.  Genitourinary:  Negative for dysuria and flank pain.  Musculoskeletal:  Negative for back pain.  Skin:  Negative for rash.  Allergic/Immunologic: Negative for food allergies.  Neurological:  Negative for dizziness and light-headedness.  Psychiatric/Behavioral:  Negative for agitation.       Physical Exam:  BP 110/75   Pulse 96   Wt 169 lb 6.4 oz (76.8 kg)   LMP 12/10/2021   BMI 26.14 kg/m  CONSTITUTIONAL: Well-developed, well-nourished female in no acute  distress.  HENT:  Normocephalic, atraumatic, External right and left ear normal. Oropharynx is clear and moist EYES: Conjunctivae normal. No scleral icterus.  NECK: Normal range of motion, supple, no masses.  Normal thyroid.  SKIN: Skin is warm and dry. No rash noted. Not diaphoretic. No erythema. No pallor. MUSCULOSKELETAL: Normal range of motion. No tenderness.  No cyanosis, clubbing, or edema.   NEUROLOGIC: Alert and oriented to person, place, and time. Normal muscle tone coordination.  PSYCHIATRIC: Normal mood and affect. Normal behavior. Normal judgment and thought content. CARDIOVASCULAR: Normal heart rate noted, regular rhythm RESPIRATORY: Clear to auscultation bilaterally. Effort and breath sounds normal, no problems with respiration noted. BREASTS: Symmetric in size. No masses, skin changes, nipple drainage, or lymphadenopathy. ABDOMEN: Soft, normal bowel sounds, no distention noted.  No tenderness, rebound or guarding. Fundal ht: 20 PELVIC: deferred FHR: 145   Assessment:    Pregnancy: G1P0000 1. Encounter for supervision of normal first pregnancy in second trimester -  TWG=39 lb 6.4 oz (17.9 kg)  - CBC/D/Plt+RPR+Rh+ABO+RubIgG... - GC/Chlamydia probe amp ([place])not at [facility] - Culture, OB Urine - Hemoglobin A1c - Panorama Prenatal Test Full Panel - HORIZON CUSTOM  2. Nausea/vomiting in pregnancy Instructed to take Reglan regularly to help gut motility Use phenergan PRN first and then Zoften ODT if needed.  - promethazine (PHENERGAN) 25 MG tablet; Take 1 tablet (25 mg total) by mouth every 6 (six) hours as needed for nausea or vomiting.  Dispense: 30 tablet; Refill: 1 - ondansetron (ZOFRAN-ODT) 4 MG disintegrating tablet; Take 1 tablet (4 mg total) by mouth every 6 (six) hours as needed for nausea.  Dispense: 20 tablet; Refill: 0 - metoCLOPramide (REGLAN) 5 MG tablet; Take 1 tablet (5 mg total) by mouth 3 (three) times daily before meals.  Dispense: 90 tablet; Refill:  1      Plan:     Initial labs drawn. Prenatal vitamins. Problem list reviewed and updated. Reviewed in detail the nature of the practice with collaborative care between  Genetic screening discussed: NIPS/First trimester screen/Quad/AFP ordered. Role of ultrasound in pregnancy discussed; Anatomy US: ordered. Amniocentesis discussed: not indicated. Follow up in 4 weeks. Discussed clinic routines, schedule of care and testing, genetic screening options, involvement of students and residents under the direct supervision of APPs and doctors and presence of female providers. Pt verbalized understanding.  Future Appointments  Date Time Provider Department Center  04/26/2022  8:45 AM [facility] NURSE [facility]  04/26/2022  9:00 AM [facility] US1 [facility]  05/22/2022  9:55 AM Venora Maples, MD [facility]  06/17/2022  8:35 AM Federico Flake, MD [facility]  06/17/2022  8:50 AM [facility] LAB [facility]     Federico Flake, MD 04/22/2022 9:00 AM  \

## 2022-04-23 LAB — HCV INTERPRETATION

## 2022-04-23 LAB — CBC/D/PLT+RPR+RH+ABO+RUBIGG...
Antibody Screen: NEGATIVE
Basophils Absolute: 0.1 10*3/uL (ref 0.0–0.2)
Basos: 1 %
EOS (ABSOLUTE): 0.2 10*3/uL (ref 0.0–0.4)
Eos: 2 %
HCV Ab: NONREACTIVE
HIV Screen 4th Generation wRfx: NONREACTIVE
Hematocrit: 38.2 % (ref 34.0–46.6)
Hemoglobin: 12.9 g/dL (ref 11.1–15.9)
Hepatitis B Surface Ag: NEGATIVE
Immature Grans (Abs): 0 10*3/uL (ref 0.0–0.1)
Immature Granulocytes: 0 %
Lymphocytes Absolute: 3.3 10*3/uL — ABNORMAL HIGH (ref 0.7–3.1)
Lymphs: 38 %
MCH: 30.9 pg (ref 26.6–33.0)
MCHC: 33.8 g/dL (ref 31.5–35.7)
MCV: 91 fL (ref 79–97)
Monocytes Absolute: 0.6 10*3/uL (ref 0.1–0.9)
Monocytes: 7 %
Neutrophils Absolute: 4.6 10*3/uL (ref 1.4–7.0)
Neutrophils: 52 %
Platelets: 266 10*3/uL (ref 150–450)
RBC: 4.18 x10E6/uL (ref 3.77–5.28)
RDW: 12.6 % (ref 11.7–15.4)
RPR Ser Ql: NONREACTIVE
Rh Factor: POSITIVE
Rubella Antibodies, IGG: 4.77 index (ref >0.99)
WBC: 8.7 10*3/uL (ref 3.4–10.8)

## 2022-04-23 LAB — HEMOGLOBIN A1C
Est. average glucose Bld gHb Est-mCnc: 105 mg/dL
Hgb A1c MFr Bld: 5.3 % (ref 4.8–5.6)

## 2022-04-23 LAB — GC/CHLAMYDIA PROBE AMP (~~LOC~~) NOT AT ARMC
Chlamydia: NEGATIVE
Comment: NEGATIVE
Comment: NORMAL
Neisseria Gonorrhea: NEGATIVE

## 2022-04-26 ENCOUNTER — Ambulatory Visit: Payer: Medicaid Other | Attending: Family Medicine

## 2022-04-26 ENCOUNTER — Encounter: Payer: Self-pay | Admitting: Family Medicine

## 2022-04-26 ENCOUNTER — Encounter: Payer: Self-pay | Admitting: *Deleted

## 2022-04-26 ENCOUNTER — Ambulatory Visit: Payer: Medicaid Other | Admitting: *Deleted

## 2022-04-26 ENCOUNTER — Other Ambulatory Visit: Payer: Self-pay | Admitting: Family Medicine

## 2022-04-26 ENCOUNTER — Other Ambulatory Visit: Payer: Self-pay | Admitting: *Deleted

## 2022-04-26 VITALS — BP 111/70 | HR 75

## 2022-04-26 DIAGNOSIS — Z363 Encounter for antenatal screening for malformations: Secondary | ICD-10-CM | POA: Diagnosis not present

## 2022-04-26 DIAGNOSIS — Z3480 Encounter for supervision of other normal pregnancy, unspecified trimester: Secondary | ICD-10-CM

## 2022-04-26 DIAGNOSIS — Z3402 Encounter for supervision of normal first pregnancy, second trimester: Secondary | ICD-10-CM

## 2022-04-26 DIAGNOSIS — R8271 Bacteriuria: Secondary | ICD-10-CM | POA: Insufficient documentation

## 2022-04-26 DIAGNOSIS — Z3492 Encounter for supervision of normal pregnancy, unspecified, second trimester: Secondary | ICD-10-CM

## 2022-04-26 DIAGNOSIS — O09892 Supervision of other high risk pregnancies, second trimester: Secondary | ICD-10-CM

## 2022-04-26 DIAGNOSIS — Z3A19 19 weeks gestation of pregnancy: Secondary | ICD-10-CM | POA: Insufficient documentation

## 2022-04-26 LAB — PANORAMA PRENATAL TEST FULL PANEL:PANORAMA TEST PLUS 5 ADDITIONAL MICRODELETIONS: FETAL FRACTION: 10.1

## 2022-04-26 LAB — CULTURE, OB URINE

## 2022-04-26 LAB — URINE CULTURE, OB REFLEX

## 2022-04-26 MED ORDER — AMOXICILLIN 250 MG PO CAPS: 250.0000 mg | ORAL_CAPSULE | Freq: Three times a day (TID) | ORAL | 0 refills | Status: DC

## 2022-04-26 NOTE — Addendum Note (Signed)
Addended by: Geanie Berlin on: 04/26/2022 02:04 PM   Modules accepted: Orders

## 2022-04-30 ENCOUNTER — Telehealth: Payer: Self-pay

## 2022-04-30 LAB — HORIZON CUSTOM: REPORT SUMMARY: NEGATIVE

## 2022-04-30 NOTE — Telephone Encounter (Signed)
-----   Message from Federico Flake, MD sent at 04/26/2022  2:04 PM EDT ----- GBS in urine. Sent in rx for amoxicillin. Please make sure patient saw message/picked up medication

## 2022-05-03 ENCOUNTER — Ambulatory Visit: Payer: Medicaid Other

## 2022-05-03 ENCOUNTER — Other Ambulatory Visit: Payer: Medicaid Other

## 2022-05-09 ENCOUNTER — Encounter: Payer: Medicaid Other | Admitting: Family Medicine

## 2022-05-13 ENCOUNTER — Telehealth: Payer: Self-pay

## 2022-05-13 NOTE — Telephone Encounter (Signed)
Failed attempt to contact patient to reschedule from Friday 9/29 - patient sent MyChart message to call office back to reschedule.

## 2022-05-16 ENCOUNTER — Encounter (HOSPITAL_COMMUNITY): Payer: Self-pay | Admitting: Emergency Medicine

## 2022-05-16 ENCOUNTER — Ambulatory Visit (INDEPENDENT_AMBULATORY_CARE_PROVIDER_SITE_OTHER): Payer: Medicaid Other

## 2022-05-16 ENCOUNTER — Ambulatory Visit (HOSPITAL_COMMUNITY)
Admission: EM | Admit: 2022-05-16 | Discharge: 2022-05-16 | Disposition: A | Discharge status: Home / Self Care | Payer: Medicaid Other | Attending: Family Medicine | Admitting: Family Medicine

## 2022-05-16 DIAGNOSIS — R519 Headache, unspecified: Secondary | ICD-10-CM | POA: Diagnosis not present

## 2022-05-16 DIAGNOSIS — J069 Acute upper respiratory infection, unspecified: Secondary | ICD-10-CM | POA: Diagnosis not present

## 2022-05-16 DIAGNOSIS — J029 Acute pharyngitis, unspecified: Secondary | ICD-10-CM | POA: Insufficient documentation

## 2022-05-16 DIAGNOSIS — R0989 Other specified symptoms and signs involving the circulatory and respiratory systems: Secondary | ICD-10-CM

## 2022-05-16 DIAGNOSIS — R0789 Other chest pain: Secondary | ICD-10-CM

## 2022-05-16 DIAGNOSIS — R059 Cough, unspecified: Secondary | ICD-10-CM | POA: Diagnosis not present

## 2022-05-16 DIAGNOSIS — R0981 Nasal congestion: Secondary | ICD-10-CM | POA: Insufficient documentation

## 2022-05-16 DIAGNOSIS — Z3A22 22 weeks gestation of pregnancy: Secondary | ICD-10-CM | POA: Insufficient documentation

## 2022-05-16 DIAGNOSIS — Z20822 Contact with and (suspected) exposure to covid-19: Secondary | ICD-10-CM | POA: Insufficient documentation

## 2022-05-16 DIAGNOSIS — O26892 Other specified pregnancy related conditions, second trimester: Secondary | ICD-10-CM | POA: Insufficient documentation

## 2022-05-16 LAB — POCT RAPID STREP A, ED / UC: Streptococcus, Group A Screen (Direct): NEGATIVE

## 2022-05-16 NOTE — Discharge Instructions (Addendum)
Your strep test is negative.  Culture of the throat will be sent, and staff will notify you if that is in turn positive.  Your chest x-ray was negative   You have been swabbed for COVID, and the test will result in the next 24 hours. Our staff will call you if positive. If the test is positive, you should quarantine for 5 days from the start of your symptoms

## 2022-05-16 NOTE — ED Triage Notes (Signed)
Pt reports cough, headache, chills, nasal congestion, chest congestion and sore throat x 3 days.   States coughing hurts her chest and she also had a nose bleed when coughing. Recently traveled to [place] and symptoms started when she returned. Also had a strep exposure.   Requesting Covid test and strep.

## 2022-05-16 NOTE — ED Provider Notes (Signed)
[place]    CSN: [id number] Arrival date & time: 05/16/22  1426      History   Chief Complaint Chief Complaint  Patient presents with   Headache   Nasal Congestion   Sore Throat   Cough    HPI Amy Nguyen is a 21 y.o. female.    Headache Associated symptoms: cough   Sore Throat Associated symptoms include headaches.  Cough Associated symptoms: headaches    Here for cough and congestion and sore throat.  Symptoms began September 18.  This when she returns from being out of town in [place].  She was exposed to someone who had strep, and then also was exposed to someone who had had COVID.  The sore throat is bothering her a lot.  She is coughing so hard that she throws up sometimes.  No nausea though.  No diarrhea she has had some headache also.  She has had some subjective fever and some chills early on   She is approximately [redacted] weeks pregnant by recent ultrasound.  Past Medical History:  Diagnosis Date   Medical history non-contributory     Patient Active Problem List   Diagnosis Date Noted   GBS bacteriuria 04/26/2022   Encounter for supervision of normal first pregnancy in second trimester 04/10/2022    Past Surgical History:  Procedure Laterality Date   NO PAST SURGERIES      OB History     Gravida  1   Para      Term      Preterm      AB      Living         SAB      IAB      Ectopic      Multiple      Live Births               Home Medications    Prior to Admission medications   Medication Sig Start Date End Date Taking? Authorizing Provider  Prenatal Vit-Fe Fumarate-FA (PRENATAL MULTIVITAMIN) TABS tablet Take 1 tablet by mouth daily at 12 noon.    [provider]  esomeprazole (NEXIUM) 20 MG capsule Take 1 capsule (20 mg total) by mouth daily before breakfast. Patient not taking: No sig reported 06/23/19 01/09/21  Jaynee Eagles, PA-C  famotidine (PEPCID) 20 MG tablet Take 1 tablet (20 mg total) by  mouth 2 (two) times daily. Patient not taking: No sig reported 06/23/19 01/09/21  Jaynee Eagles, PA-C    Family History Family History  Problem Relation Age of Onset   Diabetes Mother    Hypertension Father     Social History Social History   Tobacco Use   Smoking status: Never   Smokeless tobacco: Never  Vaping Use   Vaping Use: Some days   Substances: Nicotine, Flavoring  Substance Use Topics   Alcohol use: Never   Drug use: Never     Allergies   Patient has no known allergies.   Review of Systems Review of Systems  Respiratory:  Positive for cough.   Neurological:  Positive for headaches.     Physical Exam Triage Vital Signs ED Triage Vitals  Enc Vitals Group     BP 05/16/22 1543 110/75     Pulse Rate 05/16/22 1543 94     Resp 05/16/22 1543 18     Temp 05/16/22 1543 98.4 F (36.9 C)     Temp Source 05/16/22 1543 Oral  SpO2 05/16/22 1543 99 %     Weight --      Height --      Head Circumference --      Peak Flow --      Pain Score 05/16/22 1541 7     Pain Loc --      Pain Edu? --      Excl. in [place]? --    No data found.  Updated Vital Signs BP 110/75 (BP Location: Right Arm)   Pulse 94   Temp 98.4 F (36.9 C) (Oral)   Resp 18   LMP 12/10/2021   SpO2 99%   Visual Acuity Right Eye Distance:   Left Eye Distance:   Bilateral Distance:    Right Eye Near:   Left Eye Near:    Bilateral Near:     Physical Exam Vitals reviewed.  Constitutional:      General: She is not in acute distress.    Appearance: She is not toxic-appearing.  HENT:     Right Ear: Tympanic membrane and ear canal normal.     Left Ear: Tympanic membrane and ear canal normal.     Nose: Nose normal.     Mouth/Throat:     Mouth: Mucous membranes are moist.     Comments: There is some mild erythema of the posterior oropharynx Eyes:     Extraocular Movements: Extraocular movements intact.     Conjunctiva/sclera: Conjunctivae normal.     Pupils: Pupils are equal, round,  and reactive to light.  Cardiovascular:     Rate and Rhythm: Normal rate and regular rhythm.     Heart sounds: No murmur heard. Pulmonary:     Effort: Pulmonary effort is normal. No respiratory distress.     Breath sounds: No stridor. No wheezing, rhonchi or rales.  Musculoskeletal:     Cervical back: Neck supple.  Lymphadenopathy:     Cervical: No cervical adenopathy.  Skin:    Capillary Refill: Capillary refill takes less than 2 seconds.     Coloration: Skin is not jaundiced or pale.  Neurological:     General: No focal deficit present.     Mental Status: She is alert and oriented to person, place, and time.  Psychiatric:        Behavior: Behavior normal.      UC Treatments / Results  Labs (all labs ordered are listed, but only abnormal results are displayed) Labs Reviewed  CULTURE, GROUP A STREP ([place])  SARS CORONAVIRUS 2 (TAT 6-24 HRS)  POCT RAPID STREP A, ED / UC    EKG   Radiology DG Chest 2 View  Result Date: 05/16/2022 CLINICAL DATA:  central cp and cough and congestion for 3 days. She is [redacted] weeks pregnant EXAM: CHEST - 2 VIEW COMPARISON:  Chest x-ray 08/03/2021 FINDINGS: The heart and mediastinal contours are within normal limits. No focal consolidation. No pulmonary edema. No pleural effusion. No pneumothorax. No acute osseous abnormality. IMPRESSION: No active cardiopulmonary disease. Electronically Signed   By: Iven Finn M.D.   On: 05/16/2022 17:03    Procedures Procedures (including critical care time)  Medications Ordered in UC Medications - No data to display  Initial Impression / Assessment and Plan / UC Course  I have reviewed the triage vital signs and the nursing notes.  Pertinent labs & imaging results that were available during my care of the patient were reviewed by me and considered in my medical decision making (see chart for details).  Strep test is negative, so culture is sent and we will treat per protocol if  positive  Chest x-ray is clear; it was done with a shielded abdomen  COVID swab is done, and we will let her know if positive.  If positive she should have a prescription for Paxlovid.  Her EGFR earlier this year was over 51.  I would also have her check with her GYN provider before beginning that prescription. She is provided a list of medication she can take during pregnancy for her symptoms Final Clinical Impressions(s) / UC Diagnoses   Final diagnoses:  Viral URI with cough  Sore throat   Discharge Instructions   None    ED Prescriptions   None    PDMP not reviewed this encounter.   Barrett Henle, MD 05/16/22 1725

## 2022-05-17 LAB — SARS CORONAVIRUS 2 (TAT 6-24 HRS): SARS Coronavirus 2: NEGATIVE

## 2022-05-19 LAB — CULTURE, GROUP A STREP (THRC)

## 2022-05-22 ENCOUNTER — Encounter: Payer: Self-pay | Admitting: Family Medicine

## 2022-05-22 ENCOUNTER — Ambulatory Visit (HOSPITAL_BASED_OUTPATIENT_CLINIC_OR_DEPARTMENT_OTHER): Payer: Medicaid Other | Admitting: Maternal & Fetal Medicine

## 2022-05-22 ENCOUNTER — Ambulatory Visit (INDEPENDENT_AMBULATORY_CARE_PROVIDER_SITE_OTHER): Payer: Medicaid Other | Admitting: Family Medicine

## 2022-05-22 ENCOUNTER — Ambulatory Visit: Payer: Medicaid Other | Attending: Maternal & Fetal Medicine

## 2022-05-22 ENCOUNTER — Ambulatory Visit: Payer: Medicaid Other | Admitting: *Deleted

## 2022-05-22 ENCOUNTER — Other Ambulatory Visit: Payer: Self-pay | Admitting: *Deleted

## 2022-05-22 VITALS — BP 113/79 | HR 96 | Wt 175.4 lb

## 2022-05-22 VITALS — BP 117/69 | HR 82

## 2022-05-22 DIAGNOSIS — Z3A22 22 weeks gestation of pregnancy: Secondary | ICD-10-CM | POA: Diagnosis not present

## 2022-05-22 DIAGNOSIS — Z3689 Encounter for other specified antenatal screening: Secondary | ICD-10-CM

## 2022-05-22 DIAGNOSIS — O365990 Maternal care for other known or suspected poor fetal growth, unspecified trimester, not applicable or unspecified: Secondary | ICD-10-CM

## 2022-05-22 DIAGNOSIS — Z3402 Encounter for supervision of normal first pregnancy, second trimester: Secondary | ICD-10-CM

## 2022-05-22 DIAGNOSIS — O09892 Supervision of other high risk pregnancies, second trimester: Secondary | ICD-10-CM | POA: Diagnosis present

## 2022-05-22 DIAGNOSIS — O358XX0 Maternal care for other (suspected) fetal abnormality and damage, not applicable or unspecified: Secondary | ICD-10-CM | POA: Diagnosis not present

## 2022-05-22 DIAGNOSIS — O365920 Maternal care for other known or suspected poor fetal growth, second trimester, not applicable or unspecified: Secondary | ICD-10-CM

## 2022-05-22 DIAGNOSIS — R8271 Bacteriuria: Secondary | ICD-10-CM

## 2022-05-22 DIAGNOSIS — Z3492 Encounter for supervision of normal pregnancy, unspecified, second trimester: Secondary | ICD-10-CM | POA: Diagnosis present

## 2022-05-22 DIAGNOSIS — Z23 Encounter for immunization: Secondary | ICD-10-CM | POA: Diagnosis not present

## 2022-05-22 DIAGNOSIS — O26892 Other specified pregnancy related conditions, second trimester: Secondary | ICD-10-CM | POA: Insufficient documentation

## 2022-05-22 MED ORDER — AMOXICILLIN 250 MG PO CAPS: 250.0000 mg | ORAL_CAPSULE | Freq: Three times a day (TID) | ORAL | 0 refills | Status: AC

## 2022-05-22 NOTE — Patient Instructions (Signed)

## 2022-05-22 NOTE — Progress Notes (Signed)
MFM Consult Note Patient Name: Amy Nguyen  Patient MRN:   [id number]  Referring provider: [facility]  Reason for Consult: Fetal growth restriction   HPI: [name] is a 21 y.o. G1P0 at [redacted]w[redacted]d by [redacted]w[redacted]d [place] on 02/07/22 here for ultrasound and consultation.   Counseling  I discussed the finding of fetal growth restriction (FGR) with the patient today. The ultrasound shows an overall growth at the 7th percentile and the abdominal circumference at the10th percentile. The umbilical artery Dopplers are normal. I counseled her about the clinical significance of the Doppler findings and antenatal testing. I discussed the various causes of growth restriction including constitutionally small fetus, placental insufficiency, genetic problems and chronic maternal disease. Currently there is no evidence of sonographic stigmata suggesting infection or aneuploidy.  I discussed the most likely cause of her fetal growth restriction is either a constitutionally small fetus or placental insufficiency. We discussed it is often challenging to differentiate between a fetus that is constitutionally small but is fulfilling its growth potential and a fetus that is not fulfilling its growth potential because of an underlying pathologic condition. Approximately 70% of fetuses with birth weight below the 10th percentile for gestational age are constitutionally small; in the remaining 30%, the cause of the small size is pathologic, meeting intrauterine growth restriction definition. I also discussed the importance of antenatal fetal surveillance including antenatal testing and umbilical artery Doppler assessment to reduce the risk of stillbirth.  I discussed the management going forward in the pregnancy with potential alteration in the timing of delivery. Currently she feels well and denies headache, vision changes, right upper quadrant pain, contractions, vaginal bleeding or loss of fluid.  She reports good fetal movement.   Review  of Systems: A review of systems was performed and was negative except per HPI   Vitals and Physical Exam See intake sheet for vitals Sitting comfortably on the sonogram table Nonlabored breathing Normal rate and rhythm Abdomen is nontender  Genetic testing: Low risk NIPS  Sonographic findings Single intrauterine pregnancy at 22w 5d. Observed fetal cardiac activity. Cephalic presentation. Interval fetal anatomy appears normal.  Fetal biometry shows the estimated fetal weight at the 7 percentile.  Amniotic fluid volume: Within normal limits. MVP: 3.93 cm. Placenta: Posterior. Normal UA dopplers.   Assessment/Plan FGR - Continue UA dopplers every 1-2 weeks until delivery. If normal next week we can space out to two weeks.  - Continue to avoid tobacco use and exposure to harmful substances - Weekly NST to start at 28 weeks. - Weekly BPP to start at 32 weeks. Add twice weekly NST if UA Dopplers have peroids of absent end diastolic flow to allow for twice weekly antenatal assessment or if EFW/AC <3%.  - Betamethasone indicated if absent or reversed flow is seen or antenatal testing is abnormal in the future  - Serial growth [place] every 3 weeks until delivery  - Delivery likley around 37-38 weeks or sooner if inidcated.  - With cases of early onset growth restriction with onset at less than 32 weeks, amniocentesis to assess chromosomal MicroArray and CMV is recommended.  The patient was offered this and declined.  The patient had time to ask questions which were answered to her satisfaction.  She verbalized understanding and request to proceed with the plan. I spent 30 minutes total in patient care including chart review and counseling with the patient.  [place] [facility]

## 2022-05-22 NOTE — Progress Notes (Signed)
   Subjective:  Amy Nguyen is a 21 y.o. G1P0 at [redacted]w[redacted]d being seen today for ongoing prenatal care.  She is currently monitored for the following issues for this high-risk pregnancy and has Encounter for supervision of normal first pregnancy in second trimester; GBS bacteriuria; and IUGR (intrauterine growth restriction) affecting care of mother on their problem list.  Patient reports no complaints.  Contractions: Not present. Vag. Bleeding: None.  Movement: Present. Denies leaking of fluid.   The following portions of the patient's history were reviewed and updated as appropriate: allergies, current medications, past family history, past medical history, past social history, past surgical history and problem list. Problem list updated.  Objective:   Vitals:   05/22/22 1022  BP: 113/79  Pulse: 96  Weight: 175 lb 6.4 oz (79.6 kg)    Fetal Status: Fetal Heart Rate (bpm): 156   Movement: Present     General:  Alert, oriented and cooperative. Patient is in no acute distress.  Skin: Skin is warm and dry. No rash noted.   Cardiovascular: Normal heart rate noted  Respiratory: Normal respiratory effort, no problems with respiration noted  Abdomen: Soft, gravid, appropriate for gestational age. Pain/Pressure: Absent     Pelvic: Vag. Bleeding: None     Cervical exam deferred        Extremities: Normal range of motion.     Mental Status: Normal mood and affect. Normal behavior. Normal judgment and thought content.   Urinalysis:      Assessment and Plan:  Pregnancy: G1P0 at [redacted]w[redacted]d  1. Encounter for supervision of normal first pregnancy in second trimester BP and FHR normal Accepts flu shot and AFP today - AFP, Serum, Open Spina Bifida  2. GBS bacteriuria Noted on new OB labs Was out of town for past three weeks so antibiotic sent is not available at pharmacy anymore Resent prescription  3. Poor fetal growth affecting management of mother in second trimester, single or unspecified  fetus New diagnosis today with EFW 7%, normal cord dopplers Following w MFM  Preterm labor symptoms and general obstetric precautions including but not limited to vaginal bleeding, contractions, leaking of fluid and fetal movement were reviewed in detail with the patient. Please refer to After Visit Summary for other counseling recommendations.  Return in 4 weeks (on 06/19/2022) for [name] patient, ob visit.   Clarnce Flock, MD

## 2022-05-24 ENCOUNTER — Ambulatory Visit: Payer: Medicaid Other

## 2022-05-24 LAB — AFP, SERUM, OPEN SPINA BIFIDA
AFP MoM: 1.22
AFP Value: 97.2 ng/mL
Gest. Age on Collection Date: 22.5 weeks
Maternal Age At EDD: 19.8 yr
OSBR Risk 1 IN: 10000
Test Results:: NEGATIVE
Weight: 175 [lb_av]

## 2022-05-29 ENCOUNTER — Ambulatory Visit: Payer: Medicaid Other

## 2022-05-29 ENCOUNTER — Other Ambulatory Visit: Payer: Self-pay | Admitting: *Deleted

## 2022-05-29 ENCOUNTER — Ambulatory Visit: Payer: Medicaid Other | Attending: Maternal & Fetal Medicine

## 2022-05-29 ENCOUNTER — Ambulatory Visit: Payer: Medicaid Other | Admitting: *Deleted

## 2022-05-29 ENCOUNTER — Other Ambulatory Visit: Payer: Self-pay | Admitting: Maternal & Fetal Medicine

## 2022-05-29 VITALS — BP 123/74 | HR 88

## 2022-05-29 DIAGNOSIS — O09892 Supervision of other high risk pregnancies, second trimester: Secondary | ICD-10-CM | POA: Diagnosis present

## 2022-05-29 DIAGNOSIS — O365920 Maternal care for other known or suspected poor fetal growth, second trimester, not applicable or unspecified: Secondary | ICD-10-CM | POA: Insufficient documentation

## 2022-05-29 DIAGNOSIS — O365990 Maternal care for other known or suspected poor fetal growth, unspecified trimester, not applicable or unspecified: Secondary | ICD-10-CM | POA: Diagnosis present

## 2022-06-08 ENCOUNTER — Other Ambulatory Visit: Payer: Self-pay

## 2022-06-08 ENCOUNTER — Encounter (HOSPITAL_COMMUNITY): Payer: Self-pay | Admitting: *Deleted

## 2022-06-08 ENCOUNTER — Ambulatory Visit (HOSPITAL_COMMUNITY)
Admission: EM | Admit: 2022-06-08 | Discharge: 2022-06-08 | Disposition: A | Discharge status: Home / Self Care | Payer: Medicaid Other | Attending: Physician Assistant | Admitting: Physician Assistant

## 2022-06-08 DIAGNOSIS — M25562 Pain in left knee: Secondary | ICD-10-CM | POA: Diagnosis not present

## 2022-06-08 DIAGNOSIS — S8002XA Contusion of left knee, initial encounter: Secondary | ICD-10-CM | POA: Diagnosis not present

## 2022-06-08 NOTE — ED Notes (Signed)
Pt instructed to go to [facility] for any ABD cramping.

## 2022-06-08 NOTE — ED Triage Notes (Addendum)
Pt reports she was at work last nigh tand fell forward and hit Lt knee on metal tractor. Swelling to Lt knee. Pt has put ice on knee. Pt has pain when she straightens the knee. Pt is pregnant with due date jan/2024. Pt reports ABD cramping after fall.

## 2022-06-08 NOTE — Discharge Instructions (Addendum)
Advised to continue using ice therapy, 10 minutes on 20 minutes off, 3-4 times throughout the evening to help reduce pain and swelling. Advised take Tylenol on a regular basis to help reduce pain. Advised to use the knee sleeve to help give support when up and walking around.  Advised to report to maternity clinic if you have abdominal cramping, nausea, vomiting, vaginal drainage or vaginal bleeding.

## 2022-06-08 NOTE — ED Provider Notes (Signed)
[facility]    CSN: [id number] Arrival date & time: 06/08/22  1658      History   Chief Complaint Chief Complaint  Patient presents with   Fall    HPI Amy Nguyen is a 21 y.o. female.   21 year old female presents with left knee pain.  Patient indicates that yesterday at work she had fallen over a tractor lift contusing her left knee.  She relates that after the accident occurred she started having left knee swelling and bruising.  She indicates last night she elevated the leg and applied ice which provided improvement and help to reduce the swelling.  She indicates today she continues to have pain particularly when she walks and tries to go up steps.  She does relate that the pain has improved since last night.  She is currently taking Tylenol for pain since she is pregnant at [redacted] weeks and is due in January 2024.  She indicates that she did have some abdominal cramping after the accident and vomited twice last night.  She has not had any discomfort or vomiting since, she relates she has not having any vaginal drainage or vaginal bleeding.  She is tolerating fluids well.   Fall    Past Medical History:  Diagnosis Date   Medical history non-contributory     Patient Active Problem List   Diagnosis Date Noted   IUGR (intrauterine growth restriction) affecting care of mother 05/22/2022   GBS bacteriuria 04/26/2022   Encounter for supervision of normal first pregnancy in second trimester 04/10/2022    Past Surgical History:  Procedure Laterality Date   NO PAST SURGERIES      OB History     Gravida  1   Para      Term      Preterm      AB      Living         SAB      IAB      Ectopic      Multiple      Live Births               Home Medications    Prior to Admission medications   Medication Sig Start Date End Date Taking? Authorizing Provider  Prenatal Vit-Fe Fumarate-FA (PRENATAL MULTIVITAMIN) TABS tablet Take 1 tablet by  mouth daily at 12 noon.    [provider]  esomeprazole (NEXIUM) 20 MG capsule Take 1 capsule (20 mg total) by mouth daily before breakfast. Patient not taking: No sig reported 06/23/19 01/09/21  Wallis Bamberg, PA-C  famotidine (PEPCID) 20 MG tablet Take 1 tablet (20 mg total) by mouth 2 (two) times daily. Patient not taking: Reported on 04/05/2020 06/23/19 01/09/21  Wallis Bamberg, PA-C    Family History Family History  Problem Relation Age of Onset   Diabetes Mother    Hypertension Father     Social History Social History   Tobacco Use   Smoking status: Never   Smokeless tobacco: Never  Vaping Use   Vaping Use: Some days   Substances: Nicotine, Flavoring  Substance Use Topics   Alcohol use: Never   Drug use: Never     Allergies   Patient has no known allergies.   Review of Systems Review of Systems  Musculoskeletal:  Positive for joint swelling (left knee pain and swelling).     Physical Exam Triage Vital Signs ED Triage Vitals  Enc Vitals Group     BP 06/08/22  1748 103/65     Pulse Rate 06/08/22 1748 95     Resp 06/08/22 1748 18     Temp 06/08/22 1748 98.8 F (37.1 C)     Temp src --      SpO2 06/08/22 1748 100 %     Weight --      Height --      Head Circumference --      Peak Flow --      Pain Score 06/08/22 1745 7     Pain Loc --      Pain Edu? --      Excl. in GC? --    No data found.  Updated Vital Signs BP 103/65   Pulse 95   Temp 98.8 F (37.1 C)   Resp 18   LMP 12/10/2021   SpO2 100%   Visual Acuity Right Eye Distance:   Left Eye Distance:   Bilateral Distance:    Right Eye Near:   Left Eye Near:    Bilateral Near:     Physical Exam Constitutional:      Appearance: Normal appearance.  Musculoskeletal:       Legs:     Comments: Left knee: Pain is palpated along the mid patella area with no swelling, minimal bruising present.  Full range of motion, flexion and extension are normal.  Strength is normal.  Negative drawers  negative Lachman's.  And no crepitus with range of motion.  Neurological:     Mental Status: She is alert.      UC Treatments / Results  Labs (all labs ordered are listed, but only abnormal results are displayed) Labs Reviewed - No data to display  EKG   Radiology No results found.  Procedures Procedures (including critical care time)  Medications Ordered in UC Medications - No data to display  Initial Impression / Assessment and Plan / UC Course  I have reviewed the triage vital signs and the nursing notes.  Pertinent labs & imaging results that were available during my care of the patient were reviewed by me and considered in my medical decision making (see chart for details).    Plan: 1.  The acute pain of the knee will be treated with the following: A.  Advised patient to take Tylenol every 6 hours as needed for pain relief. 2.  The contusion of the left knee will be treated with the following: A.  Advised ice therapy, 10 minutes on 20 minutes off, 3-4 times throughout the evening to help reduce pain and swelling. B.  Advised to wear the knee brace when up and walking around to give added support. 3.  Patient advised if she has any abdominal cramping, pain, nausea or vomiting, vaginal discharge or vaginal bleeding she does report to MAU. 4.  Advised patient follow-up PCP or return to urgent care as needed Final Clinical Impressions(s) / UC Diagnoses   Final diagnoses:  Acute pain of left knee  Contusion of left knee, initial encounter     Discharge Instructions      Advised to continue using ice therapy, 10 minutes on 20 minutes off, 3-4 times throughout the evening to help reduce pain and swelling. Advised take Tylenol on a regular basis to help reduce pain. Advised to use the knee sleeve to help give support when up and walking around.  Advised to report to maternity clinic if you have abdominal cramping, nausea, vomiting, vaginal drainage or vaginal  bleeding.    ED Prescriptions  None    PDMP not reviewed this encounter.   Nyoka Lint, PA-C 06/08/22 1825

## 2022-06-10 ENCOUNTER — Ambulatory Visit: Payer: Medicaid Other

## 2022-06-10 ENCOUNTER — Ambulatory Visit: Payer: Medicaid Other | Attending: Maternal & Fetal Medicine

## 2022-06-11 DIAGNOSIS — O99512 Diseases of the respiratory system complicating pregnancy, second trimester: Secondary | ICD-10-CM | POA: Insufficient documentation

## 2022-06-11 DIAGNOSIS — O212 Late vomiting of pregnancy: Secondary | ICD-10-CM | POA: Insufficient documentation

## 2022-06-11 DIAGNOSIS — O26892 Other specified pregnancy related conditions, second trimester: Secondary | ICD-10-CM | POA: Insufficient documentation

## 2022-06-11 DIAGNOSIS — R1032 Left lower quadrant pain: Secondary | ICD-10-CM | POA: Insufficient documentation

## 2022-06-11 DIAGNOSIS — Y990 Civilian activity done for income or pay: Secondary | ICD-10-CM | POA: Insufficient documentation

## 2022-06-11 DIAGNOSIS — R8271 Bacteriuria: Secondary | ICD-10-CM | POA: Insufficient documentation

## 2022-06-11 DIAGNOSIS — R059 Cough, unspecified: Secondary | ICD-10-CM | POA: Insufficient documentation

## 2022-06-11 DIAGNOSIS — Z1152 Encounter for screening for COVID-19: Secondary | ICD-10-CM | POA: Insufficient documentation

## 2022-06-11 DIAGNOSIS — W010XXA Fall on same level from slipping, tripping and stumbling without subsequent striking against object, initial encounter: Secondary | ICD-10-CM | POA: Insufficient documentation

## 2022-06-11 DIAGNOSIS — R509 Fever, unspecified: Secondary | ICD-10-CM | POA: Insufficient documentation

## 2022-06-11 DIAGNOSIS — Z3A25 25 weeks gestation of pregnancy: Secondary | ICD-10-CM | POA: Insufficient documentation

## 2022-06-11 DIAGNOSIS — J069 Acute upper respiratory infection, unspecified: Secondary | ICD-10-CM | POA: Insufficient documentation

## 2022-06-11 DIAGNOSIS — R0981 Nasal congestion: Secondary | ICD-10-CM | POA: Insufficient documentation

## 2022-06-12 ENCOUNTER — Inpatient Hospital Stay (HOSPITAL_BASED_OUTPATIENT_CLINIC_OR_DEPARTMENT_OTHER): Payer: Medicaid Other

## 2022-06-12 ENCOUNTER — Encounter (HOSPITAL_COMMUNITY): Payer: Self-pay | Admitting: Emergency Medicine

## 2022-06-12 ENCOUNTER — Inpatient Hospital Stay (HOSPITAL_COMMUNITY)
Admission: EM | Admit: 2022-06-12 | Discharge: 2022-06-12 | Disposition: A | Discharge status: Home / Self Care | Payer: Medicaid Other | Attending: Obstetrics and Gynecology | Admitting: Obstetrics and Gynecology

## 2022-06-12 ENCOUNTER — Other Ambulatory Visit: Payer: Self-pay

## 2022-06-12 DIAGNOSIS — W19XXXA Unspecified fall, initial encounter: Secondary | ICD-10-CM

## 2022-06-12 DIAGNOSIS — O99892 Other specified diseases and conditions complicating childbirth: Secondary | ICD-10-CM

## 2022-06-12 DIAGNOSIS — J069 Acute upper respiratory infection, unspecified: Secondary | ICD-10-CM

## 2022-06-12 DIAGNOSIS — R059 Cough, unspecified: Secondary | ICD-10-CM | POA: Diagnosis not present

## 2022-06-12 DIAGNOSIS — R1084 Generalized abdominal pain: Secondary | ICD-10-CM | POA: Diagnosis not present

## 2022-06-12 DIAGNOSIS — O26892 Other specified pregnancy related conditions, second trimester: Secondary | ICD-10-CM | POA: Diagnosis present

## 2022-06-12 DIAGNOSIS — R8271 Bacteriuria: Secondary | ICD-10-CM | POA: Diagnosis not present

## 2022-06-12 DIAGNOSIS — O99512 Diseases of the respiratory system complicating pregnancy, second trimester: Secondary | ICD-10-CM | POA: Diagnosis not present

## 2022-06-12 DIAGNOSIS — Z3A25 25 weeks gestation of pregnancy: Secondary | ICD-10-CM

## 2022-06-12 DIAGNOSIS — R0981 Nasal congestion: Secondary | ICD-10-CM | POA: Diagnosis not present

## 2022-06-12 DIAGNOSIS — Z1152 Encounter for screening for COVID-19: Secondary | ICD-10-CM | POA: Diagnosis not present

## 2022-06-12 DIAGNOSIS — W010XXA Fall on same level from slipping, tripping and stumbling without subsequent striking against object, initial encounter: Secondary | ICD-10-CM | POA: Diagnosis not present

## 2022-06-12 DIAGNOSIS — R509 Fever, unspecified: Secondary | ICD-10-CM | POA: Diagnosis not present

## 2022-06-12 DIAGNOSIS — Y990 Civilian activity done for income or pay: Secondary | ICD-10-CM | POA: Diagnosis not present

## 2022-06-12 DIAGNOSIS — R1032 Left lower quadrant pain: Secondary | ICD-10-CM | POA: Diagnosis not present

## 2022-06-12 DIAGNOSIS — O212 Late vomiting of pregnancy: Secondary | ICD-10-CM | POA: Diagnosis not present

## 2022-06-12 LAB — URINALYSIS, ROUTINE W REFLEX MICROSCOPIC
Bilirubin Urine: NEGATIVE
Glucose, UA: NEGATIVE mg/dL
Hgb urine dipstick: NEGATIVE
Ketones, ur: NEGATIVE mg/dL
Nitrite: NEGATIVE
Protein, ur: 30 mg/dL — AB
Specific Gravity, Urine: 1.03 (ref 1.005–1.030)
pH: 6 (ref 5.0–8.0)

## 2022-06-12 LAB — RESP PANEL BY RT-PCR (FLU A&B, COVID) ARPGX2
Influenza A by PCR: NEGATIVE
Influenza B by PCR: NEGATIVE
SARS Coronavirus 2 by RT PCR: NEGATIVE

## 2022-06-12 NOTE — ED Provider Triage Note (Signed)
Emergency Medicine Provider Triage Evaluation Note  Amy Nguyen , a 21 y.o. female  was evaluated in triage.  Pt complains of multiple concerns.  Primary concern is nasal congestion and cough that started a few days ago.  Patient also endorses that 5 days ago she had a fall at work and landed on her belly.  Intermittent left-sided belly pain and cramping since that time and endorses several episodes of nausea and vomiting with NBNB emesis since that fall.  She is [redacted] weeks pregnant, G1, P0.  Does state she feels like baby has been moving less today than is typical, no vaginal bleeding or discharge.  Review of Systems  Positive: As above Negative: As above  Physical Exam  BP 124/81 (BP Location: Right Arm)   Pulse (!) 105   Temp 98.2 F (36.8 C) (Oral)   Resp 18   LMP 12/10/2021   SpO2 98%  Gen:   Awake, no distress   Resp:  Normal effort  MSK:   Moves extremities without difficulty  Other:  Coughing with dry cough, nasal congestion.  Lungs CTA B.  RRR no M/R/G.  Abdomen gravid but nondistended nontender.  Medical Decision Making  Medically screening exam initiated at 12:52 AM.  Appropriate orders placed.  [name] was informed that the remainder of the evaluation will be completed by another provider, this initial triage assessment does not replace that evaluation, and the importance of remaining in the ED until their evaluation is complete.  Case discussed with APP and MAU, [name], who is agreeable to excepting the patient in her department.  Report also called to MAU RN [name].  Patient transported in wheelchair to MAU.  This chart was dictated using voice recognition software, Dragon. Despite the best efforts of this provider to proofread and correct errors, errors may still occur which can change documentation meaning.    [name], PA-C 06/12/22 [phone]

## 2022-06-12 NOTE — ED Notes (Signed)
MAU RN notified on patient's condition and transfer.

## 2022-06-12 NOTE — MAU Note (Addendum)
.  Amy Nguyen is a 21 y.o. at [redacted]w[redacted]d here in MAU reporting congestion, SOB, sore throat, fever since Sat. Her boyfriend was sick last wk.Pt also fell Friday evening at work. States she fell forward and hit her knees and abdomen. Went to Urgent Care Sat for her L knee pain. Currently has some type of supportive brace on L knee. Pink spotting Sat but none since. Decreased FM today. Stabbing pain in L side that states feels "hard".  Onset of complaint: Friday Pain score: 7 generalized aching. 8 L side Vitals:   06/12/22 0113 06/12/22 0115  BP:  123/72  Pulse: (!) 111   Resp: 18   Temp: 98.8 F (37.1 C)   SpO2: 99%      FHT:148 Lab orders placed from triage:  u/a

## 2022-06-12 NOTE — ED Triage Notes (Signed)
Patient is [redacted] weeks pregnant , reports fever with cough and chest congestion with emesis this week .

## 2022-06-12 NOTE — MAU Provider Note (Signed)
History     CSN: [id number]  Arrival date and time: 06/11/22 2359   None     Chief Complaint  Patient presents with   Fever /Cough/Emesis     [redacted] weeks pregnant   21 y.o. G1 @25 .5 wks presenting with cold symptoms and fall. Reports onset of nasal congestion, sore throat, and cough 3 days ago. Reports fever earlier today of 100. She took Tylenol Cold. Reports exposure to the father of her baby who has also been sick. Reports tripping and falling while at work and landing on her knees and abdomen 4 days ago. Had some spotting the next day but none since. Denies LOF. Reports intermittent LLQ pain, unsure on frequency. Rates pain 7/10. Reports no FM felt today. Her pregnancy is complicated by FGR, teen pregnancy, and GBS bacteriuria.    OB History     Gravida  1   Para      Term      Preterm      AB      Living         SAB      IAB      Ectopic      Multiple      Live Births              Past Medical History:  Diagnosis Date   Medical history non-contributory     Past Surgical History:  Procedure Laterality Date   NO PAST SURGERIES      Family History  Problem Relation Age of Onset   Diabetes Mother    Hypertension Father     Social History   Tobacco Use   Smoking status: Never   Smokeless tobacco: Never  Vaping Use   Vaping Use: Some days   Substances: Nicotine, Flavoring  Substance Use Topics   Alcohol use: Never   Drug use: Never    Allergies: No Known Allergies  Medications Prior to Admission  Medication Sig Dispense Refill Last Dose   Prenatal Vit-Fe Fumarate-FA (PRENATAL MULTIVITAMIN) TABS tablet Take 1 tablet by mouth daily at 12 noon.       Review of Systems  Constitutional:  Positive for fever.  HENT:  Positive for congestion and sore throat.   Respiratory:  Positive for cough.   Gastrointestinal:  Positive for abdominal pain.  Genitourinary:  Negative for vaginal bleeding and vaginal discharge.  Musculoskeletal:   Positive for myalgias.   Physical Exam   Blood pressure 123/72, pulse (!) 111, temperature 98.8 F (37.1 C), resp. rate 18, height 5' 7.5" (1.715 m), last menstrual period 12/10/2021, SpO2 99 %.  Physical Exam Vitals and nursing note reviewed. Exam conducted with a chaperone present.  Constitutional:      General: She is not in acute distress.    Appearance: Normal appearance.  HENT:     Head: Normocephalic and atraumatic.     Mouth/Throat:     Lips: Pink.     Mouth: Mucous membranes are moist.     Pharynx: Oropharynx is clear. Uvula midline. No posterior oropharyngeal erythema.  Cardiovascular:     Rate and Rhythm: Normal rate and regular rhythm.     Heart sounds: Normal heart sounds. No murmur heard. Pulmonary:     Effort: Pulmonary effort is normal. No respiratory distress.     Breath sounds: No stridor. No wheezing, rhonchi or rales.  Abdominal:     Palpations: Abdomen is soft.     Tenderness: There is no abdominal tenderness.  Comments: gravid  Genitourinary:    Comments: VE: closed/thick Musculoskeletal:        General: Normal range of motion.     Cervical back: Normal range of motion.  Skin:    General: Skin is warm and dry.  Neurological:     General: No focal deficit present.     Mental Status: She is alert and oriented to person, place, and time.  Psychiatric:        Mood and Affect: Mood normal.        Behavior: Behavior normal.   EFM: 145 bpm, mod variability, + accels, no decels Toco: none  Results for orders placed or performed during the hospital encounter of 06/12/22 (from the past 24 hour(s))  Resp Panel by RT-PCR (Flu A&B, Covid) Anterior Nasal Swab     Status: None   Collection Time: 06/12/22 12:51 AM   Specimen: Anterior Nasal Swab  Result Value Ref Range   SARS Coronavirus 2 by RT PCR NEGATIVE NEGATIVE   Influenza A by PCR NEGATIVE NEGATIVE   Influenza B by PCR NEGATIVE NEGATIVE  Urinalysis, Routine w reflex microscopic Urine, Clean Catch      Status: Abnormal   Collection Time: 06/12/22  1:25 AM  Result Value Ref Range   Color, Urine YELLOW YELLOW   APPearance HAZY (A) CLEAR   Specific Gravity, Urine 1.030 1.005 - 1.030   pH 6.0 5.0 - 8.0   Glucose, UA NEGATIVE NEGATIVE mg/dL   Hgb urine dipstick NEGATIVE NEGATIVE   Bilirubin Urine NEGATIVE NEGATIVE   Ketones, ur NEGATIVE NEGATIVE mg/dL   Protein, ur 30 (A) NEGATIVE mg/dL   Nitrite NEGATIVE NEGATIVE   Leukocytes,Ua TRACE (A) NEGATIVE   RBC / HPF 0-5 0 - 5 RBC/hpf   WBC, UA 6-10 0 - 5 WBC/hpf   Bacteria, UA RARE (A) NONE SEEN   Squamous Epithelial / LPF 0-5 0 - 5   Mucus PRESENT    MAU Course  Procedures  MDM Labs and [place] ordered and reviewed. Sx consistent with resp virus.No signs of abruption or PTL. Reports good FM since EFM applied, NST reassuring. Stable for discharge home.  Assessment and Plan   1. [redacted] weeks gestation of pregnancy   2. Fall, initial encounter   3. Upper respiratory virus    Discharge home Follow up at [facility] as scheduled OTCs for symptom relief PTL/abruption precautions  Allergies as of 06/12/2022   No Known Allergies      Medication List     TAKE these medications    prenatal multivitamin Tabs tablet Take 1 tablet by mouth daily at 12 noon.        Julianne Handler, CNM 06/12/2022, 1:22 AM

## 2022-06-17 ENCOUNTER — Ambulatory Visit: Payer: Medicaid Other

## 2022-06-17 ENCOUNTER — Other Ambulatory Visit: Payer: Self-pay | Admitting: General Practice

## 2022-06-17 ENCOUNTER — Encounter: Payer: Medicaid Other | Admitting: Family Medicine

## 2022-06-17 ENCOUNTER — Other Ambulatory Visit: Payer: Medicaid Other

## 2022-06-17 DIAGNOSIS — Z3402 Encounter for supervision of normal first pregnancy, second trimester: Secondary | ICD-10-CM

## 2022-06-24 ENCOUNTER — Telehealth: Payer: Self-pay | Admitting: Family Medicine

## 2022-06-24 ENCOUNTER — Ambulatory Visit: Payer: Medicaid Other

## 2022-06-24 ENCOUNTER — Other Ambulatory Visit: Payer: Medicaid Other

## 2022-06-24 ENCOUNTER — Ambulatory Visit: Payer: Medicaid Other | Attending: Obstetrics and Gynecology

## 2022-06-24 ENCOUNTER — Encounter: Payer: Medicaid Other | Admitting: Family Medicine

## 2022-06-24 NOTE — Telephone Encounter (Signed)
Call placed back to pt. Pt did not answer. Left VM to return call to office and to MFM for missed appts.  Also sent mychart message to patient.  Colletta Maryland, RNC

## 2022-06-24 NOTE — Telephone Encounter (Signed)
Patient would like to reschedule appt that was missed today

## 2022-06-27 ENCOUNTER — Ambulatory Visit: Payer: Medicaid Other

## 2022-06-27 ENCOUNTER — Ambulatory Visit: Payer: Medicaid Other | Admitting: *Deleted

## 2022-06-27 ENCOUNTER — Other Ambulatory Visit: Payer: Self-pay | Admitting: Maternal & Fetal Medicine

## 2022-06-27 ENCOUNTER — Ambulatory Visit (HOSPITAL_BASED_OUTPATIENT_CLINIC_OR_DEPARTMENT_OTHER): Payer: Medicaid Other | Admitting: *Deleted

## 2022-06-27 ENCOUNTER — Ambulatory Visit: Payer: Medicaid Other | Attending: Maternal & Fetal Medicine

## 2022-06-27 VITALS — BP 134/75 | HR 100

## 2022-06-27 DIAGNOSIS — O365920 Maternal care for other known or suspected poor fetal growth, second trimester, not applicable or unspecified: Secondary | ICD-10-CM | POA: Insufficient documentation

## 2022-06-27 DIAGNOSIS — Z3A27 27 weeks gestation of pregnancy: Secondary | ICD-10-CM | POA: Diagnosis not present

## 2022-06-27 DIAGNOSIS — Z3402 Encounter for supervision of normal first pregnancy, second trimester: Secondary | ICD-10-CM | POA: Insufficient documentation

## 2022-06-27 DIAGNOSIS — R8271 Bacteriuria: Secondary | ICD-10-CM | POA: Insufficient documentation

## 2022-06-27 DIAGNOSIS — O321XX0 Maternal care for breech presentation, not applicable or unspecified: Secondary | ICD-10-CM | POA: Diagnosis not present

## 2022-06-27 DIAGNOSIS — O09892 Supervision of other high risk pregnancies, second trimester: Secondary | ICD-10-CM

## 2022-06-27 NOTE — Procedures (Signed)
Amy Nguyen 2001/06/26 [redacted]w[redacted]d  Fetus A Non-Stress Test Interpretation for 06/27/22  Indication: IUGR  Fetal Heart Rate A Mode: External Baseline Rate (A): 140 bpm Variability: Moderate, Minimal Accelerations: 10 x 10 Decelerations: None Multiple birth?: No  Uterine Activity Mode: Palpation, Toco Contraction Frequency (min): none Resting Tone Palpated: Relaxed  Interpretation (Fetal Testing) Nonstress Test Interpretation: Reactive Overall Impression: Reassuring for gestational age Comments: Dr. Annamaria Boots reviewed tracing

## 2022-06-28 ENCOUNTER — Other Ambulatory Visit: Payer: Self-pay | Admitting: *Deleted

## 2022-06-28 DIAGNOSIS — O365920 Maternal care for other known or suspected poor fetal growth, second trimester, not applicable or unspecified: Secondary | ICD-10-CM

## 2022-07-02 ENCOUNTER — Encounter: Payer: Medicaid Other | Admitting: Family Medicine

## 2022-07-17 ENCOUNTER — Encounter: Payer: Self-pay | Admitting: Family Medicine

## 2022-07-17 ENCOUNTER — Other Ambulatory Visit: Payer: Medicaid Other

## 2022-07-17 ENCOUNTER — Ambulatory Visit (INDEPENDENT_AMBULATORY_CARE_PROVIDER_SITE_OTHER): Payer: Medicaid Other | Admitting: Family Medicine

## 2022-07-17 VITALS — BP 130/84 | HR 95 | Wt 191.3 lb

## 2022-07-17 DIAGNOSIS — Z23 Encounter for immunization: Secondary | ICD-10-CM

## 2022-07-17 DIAGNOSIS — Z3402 Encounter for supervision of normal first pregnancy, second trimester: Secondary | ICD-10-CM

## 2022-07-17 DIAGNOSIS — R8271 Bacteriuria: Secondary | ICD-10-CM

## 2022-07-17 DIAGNOSIS — O365920 Maternal care for other known or suspected poor fetal growth, second trimester, not applicable or unspecified: Secondary | ICD-10-CM

## 2022-07-17 NOTE — Patient Instructions (Signed)

## 2022-07-17 NOTE — Progress Notes (Signed)
   Subjective:  Amy Nguyen is a 21 y.o. G1P0 at [redacted]w[redacted]d being seen today for ongoing prenatal care.  She is currently monitored for the following issues for this high-risk pregnancy and has Encounter for supervision of normal first pregnancy in second trimester; GBS bacteriuria; and IUGR (intrauterine growth restriction) affecting care of mother on their problem list.  Patient reports fatigue.  Contractions: Not present. Vag. Bleeding: None.  Movement: Present. Denies leaking of fluid.   The following portions of the patient's history were reviewed and updated as appropriate: allergies, current medications, past family history, past medical history, past social history, past surgical history and problem list. Problem list updated.  Objective:   Vitals:   07/17/22 1030  BP: 130/84  Pulse: 95  Weight: 191 lb 4.8 oz (86.8 kg)    Fetal Status: Fetal Heart Rate (bpm): 156   Movement: Present     General:  Alert, oriented and cooperative. Patient is in no acute distress.  Skin: Skin is warm and dry. No rash noted.   Cardiovascular: Normal heart rate noted  Respiratory: Normal respiratory effort, no problems with respiration noted  Abdomen: Soft, gravid, appropriate for gestational age. Pain/Pressure: Absent     Pelvic: Vag. Bleeding: None     Cervical exam deferred        Extremities: Normal range of motion.     Mental Status: Normal mood and affect. Normal behavior. Normal judgment and thought content.   Urinalysis:      Assessment and Plan:  Pregnancy: G1P0 at [redacted]w[redacted]d  1. Encounter for supervision of normal first pregnancy in second trimester BP and FHR normal Getting third trimester labs today, accepts tdap, has already had flu Asked for work note, given today, but discussed that taking FMLA now would mean less after baby is born - Tdap vaccine greater than or equal to 7yo IM  2. GBS bacteriuria Ppx in labor  3. Poor fetal growth affecting management of mother in second  trimester, single or unspecified fetus Resolved on most recent scan from 06/27/2022, EFW 18%, normal dopplers Has follow up scheduled w MFM next week  Preterm labor symptoms and general obstetric precautions including but not limited to vaginal bleeding, contractions, leaking of fluid and fetal movement were reviewed in detail with the patient. Please refer to After Visit Summary for other counseling recommendations.  Return in 2 weeks (on 07/31/2022) for [name] patient, ob visit.   Venora Maples, MD

## 2022-07-18 LAB — CBC
Hematocrit: 32.7 % — ABNORMAL LOW (ref 34.0–46.6)
Hemoglobin: 10.9 g/dL — ABNORMAL LOW (ref 11.1–15.9)
MCH: 29.7 pg (ref 26.6–33.0)
MCHC: 33.3 g/dL (ref 31.5–35.7)
MCV: 89 fL (ref 79–97)
Platelets: 308 10*3/uL (ref 150–450)
RBC: 3.67 x10E6/uL — ABNORMAL LOW (ref 3.77–5.28)
RDW: 12 % (ref 11.7–15.4)
WBC: 13.1 10*3/uL — ABNORMAL HIGH (ref 3.4–10.8)

## 2022-07-18 LAB — HIV ANTIBODY (ROUTINE TESTING W REFLEX): HIV Screen 4th Generation wRfx: NONREACTIVE

## 2022-07-18 LAB — GLUCOSE TOLERANCE, 2 HOURS W/ 1HR
Glucose, 1 hour: 131 mg/dL (ref 70–179)
Glucose, 2 hour: 99 mg/dL (ref 70–152)
Glucose, Fasting: 78 mg/dL (ref 70–91)

## 2022-07-18 LAB — RPR: RPR Ser Ql: NONREACTIVE

## 2022-07-23 ENCOUNTER — Ambulatory Visit: Payer: Medicaid Other

## 2022-07-23 ENCOUNTER — Telehealth: Payer: Self-pay

## 2022-07-23 NOTE — Telephone Encounter (Signed)
Patient called in to let us know that she would be late for appointment (ultrasound at 1:45pm) - I explained that she would have to be here and register by 1:45pm or we would have to reschedule her.  Patient called back and requested that we go ahead and reschedule her.  Patient rescheduled for Thursday 07/25/22 at 12:30pm for Nurse visit and 12:45pm for ultrasound.

## 2022-07-25 ENCOUNTER — Ambulatory Visit: Payer: Medicaid Other | Attending: Obstetrics and Gynecology

## 2022-07-25 ENCOUNTER — Ambulatory Visit: Payer: Medicaid Other | Admitting: *Deleted

## 2022-07-25 VITALS — BP 117/76 | HR 95

## 2022-07-25 DIAGNOSIS — Z3A31 31 weeks gestation of pregnancy: Secondary | ICD-10-CM

## 2022-07-25 DIAGNOSIS — O365930 Maternal care for other known or suspected poor fetal growth, third trimester, not applicable or unspecified: Secondary | ICD-10-CM

## 2022-07-25 DIAGNOSIS — O365920 Maternal care for other known or suspected poor fetal growth, second trimester, not applicable or unspecified: Secondary | ICD-10-CM | POA: Insufficient documentation

## 2022-07-30 ENCOUNTER — Encounter: Payer: Medicaid Other | Admitting: Family Medicine

## 2022-08-05 ENCOUNTER — Telehealth: Payer: Self-pay | Admitting: Family Medicine

## 2022-08-05 MED ORDER — ONDANSETRON 4 MG PO TBDP: 4.0000 mg | ORAL_TABLET | Freq: Three times a day (TID) | ORAL | 0 refills | Status: DC | PRN

## 2022-08-05 NOTE — Telephone Encounter (Signed)
Patient tested positive for Covid, want to know what she can take

## 2022-08-05 NOTE — Telephone Encounter (Signed)
Call placed back to pt. Spoke to pt.  Pt states symptoms started on Friday night, sat and sun were worse, better today. Tested for Covid at home  and was positive on 12/10 PM. Fever, body aches, cough, congestion was also her symptoms. Pt reports was exposed by supervisor at work last week. Having nausea and vomiting, unable to keep fluids down today. Doesn't have any Rx Zofran left at home. Sent in Rx # 30, 0RF. Pt aware. Pt advised to continue to monitor symptoms and be seen if symptoms worsen or has DFM, VB or leaking of fluid or symptoms dont resolve with meds, been seen at MAU. Pt agreeable to plan of care. Pt sent mychart letter for RTW on 12/16. Judeth Cornfield, RNC

## 2022-08-12 ENCOUNTER — Encounter: Payer: Self-pay | Admitting: *Deleted

## 2022-08-14 ENCOUNTER — Encounter: Payer: Medicaid Other | Admitting: Family Medicine

## 2022-08-26 NOTE — L&D Delivery Note (Signed)
[facility] Delivery Note  Amy Nguyen is a 22 y.o. G1P0 s/p vag del at [redacted]w[redacted]d. She was admitted for SOL.   ROM: 5h 81m with mod MSF fluid GBS Status: pos (bacteriuria) Maximum Maternal Temperature: 98.3  Labor Progress: Amy Nguyen was admitted in latent labor; she also reported some decreased FM. Upon admission she had some mild BP elevations giving an intrapartum dx of gHTN (asymptomatic, neg pre-e labs); she had AROM for augmentation prior to becoming completely dilated and laboring down; she pushed well x approx 15 mins to vag delivery.   Delivery Date/Time: January 26th, 2024 at 2314 Delivery: Called to room and patient was complete and pushing. Head delivered ROA. No nuchal cord present. Shoulder and body delivered in usual fashion. Infant with spontaneous cry, placed on mother's abdomen, dried and stimulated. Cord clamped x 2 after 1-minute delay, and cut by FOB. Cord blood drawn. Placenta delivered spontaneously with gentle cord traction. Fundus firm with massage and Pitocin. Labia, perineum, vagina, and cervix inspected and found to be intact.   Placenta: spont, intact; to L&D Complications: none (peds team called for delivery due to MSF, but infant was doing well and stayed skin-to-skin) Lacerations: none EBL: 375cc Analgesia: epidural  Postpartum Planning [x]  message to sent to schedule follow-up   Infant: girl  APGARs 9/9  3090g (6lb 13oz)  Myrtis Ser, CNM  09/20/2022 11:33 PM

## 2022-08-28 ENCOUNTER — Encounter: Payer: Medicaid Other | Admitting: Family Medicine

## 2022-08-30 ENCOUNTER — Encounter: Payer: Medicaid Other | Admitting: Family Medicine

## 2022-09-03 ENCOUNTER — Encounter (HOSPITAL_COMMUNITY): Payer: Self-pay | Admitting: Obstetrics and Gynecology

## 2022-09-03 ENCOUNTER — Inpatient Hospital Stay (HOSPITAL_COMMUNITY)
Admission: AD | Admit: 2022-09-03 | Discharge: 2022-09-03 | Disposition: A | Discharge status: Home / Self Care | Payer: Medicaid Other | Attending: Obstetrics and Gynecology | Admitting: Obstetrics and Gynecology

## 2022-09-03 DIAGNOSIS — R0602 Shortness of breath: Secondary | ICD-10-CM | POA: Insufficient documentation

## 2022-09-03 DIAGNOSIS — O26893 Other specified pregnancy related conditions, third trimester: Secondary | ICD-10-CM | POA: Insufficient documentation

## 2022-09-03 DIAGNOSIS — O219 Vomiting of pregnancy, unspecified: Secondary | ICD-10-CM | POA: Diagnosis not present

## 2022-09-03 DIAGNOSIS — Z3402 Encounter for supervision of normal first pregnancy, second trimester: Secondary | ICD-10-CM

## 2022-09-03 DIAGNOSIS — Z3689 Encounter for other specified antenatal screening: Secondary | ICD-10-CM

## 2022-09-03 DIAGNOSIS — O98513 Other viral diseases complicating pregnancy, third trimester: Secondary | ICD-10-CM

## 2022-09-03 DIAGNOSIS — Z3A37 37 weeks gestation of pregnancy: Secondary | ICD-10-CM | POA: Diagnosis not present

## 2022-09-03 DIAGNOSIS — Z79899 Other long term (current) drug therapy: Secondary | ICD-10-CM | POA: Diagnosis not present

## 2022-09-03 DIAGNOSIS — O212 Late vomiting of pregnancy: Secondary | ICD-10-CM | POA: Insufficient documentation

## 2022-09-03 DIAGNOSIS — R8271 Bacteriuria: Secondary | ICD-10-CM

## 2022-09-03 DIAGNOSIS — R112 Nausea with vomiting, unspecified: Secondary | ICD-10-CM | POA: Diagnosis present

## 2022-09-03 DIAGNOSIS — U071 COVID-19: Secondary | ICD-10-CM

## 2022-09-03 LAB — URINALYSIS, ROUTINE W REFLEX MICROSCOPIC
Bacteria, UA: NONE SEEN
Bilirubin Urine: NEGATIVE
Glucose, UA: >=500 mg/dL — AB
Hgb urine dipstick: NEGATIVE
Ketones, ur: NEGATIVE mg/dL
Leukocytes,Ua: NEGATIVE
Nitrite: NEGATIVE
Protein, ur: 30 mg/dL — AB
Specific Gravity, Urine: 1.022 (ref 1.005–1.030)
pH: 6 (ref 5.0–8.0)

## 2022-09-03 MED ORDER — ONDANSETRON HCL 4 MG/2ML IJ SOLN
4.0000 mg | Freq: Once | INTRAMUSCULAR | Status: AC
  Administered 2022-09-03: 4 mg via INTRAVENOUS
  Filled 2022-09-03: qty 2

## 2022-09-03 MED ORDER — LACTATED RINGERS IV BOLUS
1000.0000 mL | Freq: Once | INTRAVENOUS | Status: AC
  Administered 2022-09-03: 1000 mL via INTRAVENOUS

## 2022-09-03 MED ORDER — PROMETHAZINE HCL 12.5 MG PO TABS: 12.5000 mg | ORAL_TABLET | Freq: Four times a day (QID) | ORAL | 0 refills | Status: DC | PRN

## 2022-09-03 MED ORDER — FAMOTIDINE 10 MG PO TABS: 10.0000 mg | ORAL_TABLET | Freq: Two times a day (BID) | ORAL | 0 refills | Status: DC

## 2022-09-03 MED ORDER — FAMOTIDINE IN NACL 20-0.9 MG/50ML-% IV SOLN
20.0000 mg | Freq: Once | INTRAVENOUS | Status: AC
  Administered 2022-09-03: 20 mg via INTRAVENOUS
  Filled 2022-09-03: qty 50

## 2022-09-03 NOTE — MAU Provider Note (Signed)
History     CSN: [id number]  Arrival date and time: 09/03/22 2878   Event Date/Time   First Provider Initiated Contact with Patient 09/03/22 [phone]      Chief Complaint  Patient presents with   Shortness of Breath   Shortness of Breath Associated symptoms include vomiting.   Amy Nguyen is a 22 y.o. G1P0 at [redacted]w[redacted]d who presents to MAU with chief complaint of SOB. This is a recurrent problem, onset with patient's COVID diagnosis 08/04/2022. She is able to speak normally, laugh and perform ADLs as she normally does, but "occasionally" she has to stop to catch her breath.   Patient also reports recurrent vomiting, which has worsened since her COVID diagnosis. She last took Zofran yesterday 09/02/2022. She states when she takes it on a regular basis she is able to tolerate PO intake.  Patient endorses occasional contractions and lower abdominal pressure. She denies vaginal bleeding, leaking of fluid, decreased fetal movement, fever, falls, or recent illness.   OB history includes: --IUGR (resolved as of 07/25/22) --Teen pregnancy --GBS bacteruria  OB History     Gravida  1   Para      Term      Preterm      AB      Living         SAB      IAB      Ectopic      Multiple      Live Births              Past Medical History:  Diagnosis Date   Medical history non-contributory     Past Surgical History:  Procedure Laterality Date   NO PAST SURGERIES      Family History  Problem Relation Age of Onset   Diabetes Mother    Hypertension Father     Social History   Tobacco Use   Smoking status: Never   Smokeless tobacco: Never  Vaping Use   Vaping Use: Some days   Substances: Nicotine, Flavoring  Substance Use Topics   Alcohol use: Never   Drug use: Never    Allergies: No Known Allergies  Medications Prior to Admission  Medication Sig Dispense Refill Last Dose   ondansetron (ZOFRAN-ODT) 4 MG disintegrating tablet Take 1 tablet (4 mg total)  by mouth every 8 (eight) hours as needed for nausea or vomiting. 30 tablet 0 Past Week   Prenatal Vit-Fe Fumarate-FA (PRENATAL MULTIVITAMIN) TABS tablet Take 1 tablet by mouth daily at 12 noon.   09/02/2022    Review of Systems  Respiratory:  Positive for shortness of breath.   Gastrointestinal:  Positive for nausea and vomiting.  All other systems reviewed and are negative.  Physical Exam   Blood pressure 123/67, pulse 93, temperature 98.5 F (36.9 C), temperature source Oral, resp. rate 18, height 5\' 8"  (1.727 m), weight 90.7 kg, last menstrual period 12/10/2021, SpO2 100 %.  Physical Exam Vitals and nursing note reviewed.  Constitutional:      General: She is not in acute distress.    Appearance: She is well-developed. She is not ill-appearing.  Cardiovascular:     Rate and Rhythm: Regular rhythm. Tachycardia present.  Pulmonary:     Effort: Pulmonary effort is normal.     Breath sounds: Normal breath sounds.  Skin:    Capillary Refill: Capillary refill takes less than 2 seconds.  Neurological:     Mental Status: She is alert and oriented to person, place,  and time.    MAU Course  Procedures  MDM  --Reactive tracing: baseline 135, mod var, + accels, no decels --Toco: occasional contractions  Orders Placed This Encounter  Procedures   Urinalysis, Routine w reflex microscopic Urine, Clean Catch   Insert peripheral IV   Patient Vitals for the past 24 hrs:  BP Temp Temp src Pulse Resp SpO2 Height Weight  09/03/22 0644 123/67 -- -- 93 -- -- -- --  09/03/22 0613 132/71 98.5 F (36.9 C) Oral (!) 111 18 100 % 5\' 8"  (1.727 m) 90.7 kg   Results for orders placed or performed during the hospital encounter of 09/03/22 (from the past 72 hour(s))  Urinalysis, Routine w reflex microscopic Urine, Clean Catch     Status: Abnormal   Collection Time: 09/03/22  6:35 AM  Result Value Ref Range   Color, Urine YELLOW YELLOW   APPearance CLEAR CLEAR   Specific Gravity, Urine 1.022  1.005 - 1.030   pH 6.0 5.0 - 8.0   Glucose, UA >=500 (A) NEGATIVE mg/dL   Hgb urine dipstick NEGATIVE NEGATIVE   Bilirubin Urine NEGATIVE NEGATIVE   Ketones, ur NEGATIVE NEGATIVE mg/dL   Protein, ur 30 (A) NEGATIVE mg/dL   Nitrite NEGATIVE NEGATIVE   Leukocytes,Ua NEGATIVE NEGATIVE   RBC / HPF 0-5 0 - 5 RBC/hpf   WBC, UA 0-5 0 - 5 WBC/hpf   Bacteria, UA NONE SEEN NONE SEEN   Squamous Epithelial / HPF 0-5 0 - 5 /HPF   Mucus PRESENT     Comment: Performed at [facility], [address] [place], [place]   Meds ordered this encounter  Medications   lactated ringers bolus 1,000 mL   famotidine (PEPCID) IVPB 20 mg premix   ondansetron (ZOFRAN) injection 4 mg   promethazine (PHENERGAN) 12.5 MG tablet    Sig: Take 1 tablet (12.5 mg total) by mouth every 6 (six) hours as needed for nausea or vomiting.    Dispense:  30 tablet    Refill:  0    Order Specific Question:   Supervising Provider    Answer:   [id number], MICHAEL L [[id number]]   famotidine (PEPCID) 10 MG tablet    Sig: Take 1 tablet (10 mg total) by mouth 2 (two) times daily for 14 days.    Dispense:  28 tablet    Refill:  0    Order Specific Question:   Supervising Provider    Answer:   Alysia Penna L [[id number]]    Assessment and Plan  --22 y.o. G1P0 at [redacted]w[redacted]d  --Reactive tracing, closed cervix --No acute symptoms during encounter --Vomiting well-controlled on Zofran, offered second agent --Discharge home in stable condition  [redacted]w[redacted]d, MSA, MSN, CNM Certified Nurse Midwife, [employer], clinical, [facility]

## 2022-09-03 NOTE — MAU Note (Addendum)
Pt says she had Covid on 08-04-2022 Had neg test on 08-10-2022 But she still feels bad Feels like she still can't breathe  Feels some UC and pelvic pressure  PNC- clinic Denies HSV GBS- positive  Has vomited with/ since Covid - takes Zofran with some relief - last time  vomited 0100.

## 2022-09-03 NOTE — Discharge Instructions (Signed)

## 2022-09-05 ENCOUNTER — Encounter: Payer: Self-pay | Admitting: Family Medicine

## 2022-09-05 ENCOUNTER — Ambulatory Visit (INDEPENDENT_AMBULATORY_CARE_PROVIDER_SITE_OTHER): Payer: Medicaid Other | Admitting: Family Medicine

## 2022-09-05 ENCOUNTER — Other Ambulatory Visit (HOSPITAL_COMMUNITY)
Admission: RE | Admit: 2022-09-05 | Discharge: 2022-09-05 | Disposition: A | Discharge status: Home / Self Care | Payer: Medicaid Other | Source: Ambulatory Visit | Attending: Family Medicine | Admitting: Family Medicine

## 2022-09-05 VITALS — BP 123/89 | HR 83 | Wt 195.8 lb

## 2022-09-05 DIAGNOSIS — Z3402 Encounter for supervision of normal first pregnancy, second trimester: Secondary | ICD-10-CM | POA: Insufficient documentation

## 2022-09-05 DIAGNOSIS — Z3A37 37 weeks gestation of pregnancy: Secondary | ICD-10-CM

## 2022-09-05 DIAGNOSIS — R8271 Bacteriuria: Secondary | ICD-10-CM

## 2022-09-05 DIAGNOSIS — O98513 Other viral diseases complicating pregnancy, third trimester: Secondary | ICD-10-CM

## 2022-09-05 DIAGNOSIS — U071 COVID-19: Secondary | ICD-10-CM

## 2022-09-05 NOTE — Progress Notes (Signed)
   Subjective:  Amy Nguyen is a 22 y.o. G1P0 at [redacted]w[redacted]d being seen today for ongoing prenatal care.  She is currently monitored for the following issues for this low-risk pregnancy and has Encounter for supervision of normal first pregnancy in second trimester; GBS bacteriuria; and COVID-19 affecting pregnancy in third trimester on their problem list.  Patient reports nausea.  Contractions: Irritability. Vag. Bleeding: None.  Movement: Present. Denies leaking of fluid.   The following portions of the patient's history were reviewed and updated as appropriate: allergies, current medications, past family history, past medical history, past social history, past surgical history and problem list. Problem list updated.  Objective:   Vitals:   09/05/22 0944  BP: 123/89  Pulse: 83  Weight: 195 lb 12.8 oz (88.8 kg)    Fetal Status: Fetal Heart Rate (bpm): 158   Movement: Present     General:  Alert, oriented and cooperative. Patient is in no acute distress.  Skin: Skin is warm and dry. No rash noted.   Cardiovascular: Normal heart rate noted  Respiratory: Normal respiratory effort, no problems with respiration noted  Abdomen: Soft, gravid, appropriate for gestational age. Pain/Pressure: Present     Pelvic: Vag. Bleeding: None     Cervical exam deferred        Extremities: Normal range of motion.     Mental Status: Normal mood and affect. Normal behavior. Normal judgment and thought content.   Urinalysis:      Assessment and Plan:  Pregnancy: G1P0 at [redacted]w[redacted]d  1. Encounter for supervision of normal first pregnancy in second trimester BP and FHR normal GC/CT swab collected today Having nausea on and off but doesn't think she's in labor Reviewed labor precautions Wants to be written out of work, said this is fine, she will bring paperwork to next visit  2. GBS bacteriuria Ppx in labor  3. COVID-19 affecting pregnancy in third trimester +08/04/22  Term labor symptoms and general  obstetric precautions including but not limited to vaginal bleeding, contractions, leaking of fluid and fetal movement were reviewed in detail with the patient. Please refer to After Visit Summary for other counseling recommendations.  Return in 1 week (on 09/12/2022) for [name] patient, ob visit.   Clarnce Flock, MD

## 2022-09-05 NOTE — Patient Instructions (Signed)

## 2022-09-06 LAB — GC/CHLAMYDIA PROBE AMP (~~LOC~~) NOT AT ARMC
Chlamydia: NEGATIVE
Comment: NEGATIVE
Comment: NORMAL
Neisseria Gonorrhea: NEGATIVE

## 2022-09-09 ENCOUNTER — Encounter (HOSPITAL_COMMUNITY): Payer: Self-pay | Admitting: Family Medicine

## 2022-09-09 ENCOUNTER — Inpatient Hospital Stay (HOSPITAL_COMMUNITY)
Admission: AD | Admit: 2022-09-09 | Discharge: 2022-09-09 | Disposition: A | Discharge status: Home / Self Care | Payer: Medicaid Other | Attending: Family Medicine | Admitting: Family Medicine

## 2022-09-09 DIAGNOSIS — Z3A38 38 weeks gestation of pregnancy: Secondary | ICD-10-CM | POA: Diagnosis not present

## 2022-09-09 DIAGNOSIS — O479 False labor, unspecified: Secondary | ICD-10-CM

## 2022-09-09 DIAGNOSIS — O471 False labor at or after 37 completed weeks of gestation: Secondary | ICD-10-CM | POA: Insufficient documentation

## 2022-09-09 DIAGNOSIS — R110 Nausea: Secondary | ICD-10-CM | POA: Insufficient documentation

## 2022-09-09 DIAGNOSIS — Z3689 Encounter for other specified antenatal screening: Secondary | ICD-10-CM

## 2022-09-09 DIAGNOSIS — O26893 Other specified pregnancy related conditions, third trimester: Secondary | ICD-10-CM | POA: Insufficient documentation

## 2022-09-09 DIAGNOSIS — O368130 Decreased fetal movements, third trimester, not applicable or unspecified: Secondary | ICD-10-CM | POA: Insufficient documentation

## 2022-09-09 DIAGNOSIS — M7989 Other specified soft tissue disorders: Secondary | ICD-10-CM | POA: Diagnosis not present

## 2022-09-09 NOTE — MAU Note (Addendum)
.  Amy Nguyen is a 22 y.o. at [redacted]w[redacted]d here in MAU reporting: she has been having lower abdominal and pelvic pressure/ctx that started last night (7/10) and is irregular. Reports that she has not felt FM today and last felt baby move yesterday. She also reports on-going nausea and an episode of emesis one hour ago. She last took her nausea medications last night. She also reports that she noticed swelling in her feet last night that she found concerning. Denies VB or LOF.    LMP: N/A Onset of complaint: Last night Pain score: 7/10 Vitals:   09/09/22 1048 09/09/22 1054  BP: 116/76 116/72  Pulse: 85 84  Resp: 20   Temp: 97.8 F (36.6 C)   SpO2: 100%      FHT:140 Lab orders placed from triage:  N/A

## 2022-09-09 NOTE — MAU Provider Note (Signed)
History     CSN: [id number]  Arrival date and time: 09/09/22 1022   Event Date/Time   First Provider Initiated Contact with Patient 09/09/22 1056      Chief Complaint  Patient presents with   Abdominal Pain   Pelvic Pain   Nausea   Decreased Fetal Movement   Contractions   HPI Amy Nguyen is a 22 y.o. G1P0 at [redacted]w[redacted]d who presents to MAU with chief complaint of contractions. She reports new onset painful contractions beginning last night and continuing through the morning. Pain score is 7/10. She has not taken medication for this complaint.  She denies vaginal bleeding, dysuria, fever or recent illness.  Patient also reports decreased fetal movement. She last felt movement yesterday 09/08/22. She endorses active fetal movement with application of monitors in MAU.  Patient also reports bilateral swelling in her legs. Patient states she has never suffered from edema before. She was shocked by how swollen her legs were this morning but feels they are much better now. She denies unilateral swelling. She is not experiencing calf pain, skin discoloration or activity intolerance.  Patient also reports nausea. This is a recurrent problem which patient effectively manages with Zofran.  OB History     Gravida  1   Para      Term      Preterm      AB      Living         SAB      IAB      Ectopic      Multiple      Live Births              Past Medical History:  Diagnosis Date   Medical history non-contributory     Past Surgical History:  Procedure Laterality Date   NO PAST SURGERIES      Family History  Problem Relation Age of Onset   Diabetes Mother    Hypertension Father     Social History   Tobacco Use   Smoking status: Never   Smokeless tobacco: Never  Vaping Use   Vaping Use: Some days   Substances: Nicotine, Flavoring  Substance Use Topics   Alcohol use: Never   Drug use: Never    Allergies: No Known Allergies  Medications Prior  to Admission  Medication Sig Dispense Refill Last Dose   famotidine (PEPCID) 10 MG tablet Take 1 tablet (10 mg total) by mouth 2 (two) times daily for 14 days. 28 tablet 0 09/08/2022   ondansetron (ZOFRAN-ODT) 4 MG disintegrating tablet Take 1 tablet (4 mg total) by mouth every 8 (eight) hours as needed for nausea or vomiting. 30 tablet 0 09/08/2022   Prenatal Vit-Fe Fumarate-FA (PRENATAL MULTIVITAMIN) TABS tablet Take 1 tablet by mouth daily at 12 noon.   09/08/2022   promethazine (PHENERGAN) 12.5 MG tablet Take 1 tablet (12.5 mg total) by mouth every 6 (six) hours as needed for nausea or vomiting. 30 tablet 0     Review of Systems  Gastrointestinal:  Positive for abdominal pain.  All other systems reviewed and are negative.  Physical Exam   Blood pressure 116/72, pulse 84, temperature 97.8 F (36.6 C), temperature source Oral, resp. rate 20, height 5\' 8"  (1.727 m), weight 92.1 kg, last menstrual period 12/10/2021, SpO2 100 %.  Physical Exam Vitals and nursing note reviewed. Exam conducted with a chaperone present.  Constitutional:      Appearance: She is well-developed. She is not ill-appearing.  Pulmonary:     Effort: Pulmonary effort is normal.  Abdominal:     Tenderness: There is no abdominal tenderness.     Comments: Gravid  Musculoskeletal:     Right lower leg: 1+ Edema present.     Left lower leg: 1+ Edema present.  Skin:    Capillary Refill: Capillary refill takes less than 2 seconds.  Neurological:     Mental Status: She is alert and oriented to person, place, and time.  Psychiatric:        Mood and Affect: Mood normal.        Behavior: Behavior normal.     MAU Course  Procedures  MDM  --Reactive tracing: baseline 140, mod var, + accels, no decels --Toco: UI --Trace edema. Reviewed pathophysiology of dependent edema, increased circulating volume, expectations for swelling approaching EDC and in immediate postpartum period  Patient Vitals for the past 24 hrs:   BP Temp Temp src Pulse Resp SpO2 Height Weight  09/09/22 1155 114/67 -- -- 78 -- -- -- --  09/09/22 1054 116/72 -- -- 84 -- -- -- --  09/09/22 1048 116/76 97.8 F (36.6 C) Oral 85 20 100 % 5\' 8"  (1.727 m) 92.1 kg   Assessment and Plan  --22 y.o. G1P0 at [redacted]w[redacted]d  --Reactive tracing --Closed cervix, patient declined recheck of cervix prior to discharge --Patient detecting fetal movement throughout encounter --Discharge home in stable condition  F/U: --[facility] patient. Next appt is 09/11/2022  Darlina Rumpf, [place], MSN, CNM 09/09/2022, 2:19 PM

## 2022-09-11 ENCOUNTER — Ambulatory Visit (INDEPENDENT_AMBULATORY_CARE_PROVIDER_SITE_OTHER): Payer: Medicaid Other | Admitting: Family Medicine

## 2022-09-11 VITALS — BP 124/78 | HR 92 | Wt 197.1 lb

## 2022-09-11 DIAGNOSIS — R8271 Bacteriuria: Secondary | ICD-10-CM

## 2022-09-11 DIAGNOSIS — Z3402 Encounter for supervision of normal first pregnancy, second trimester: Secondary | ICD-10-CM

## 2022-09-11 NOTE — Patient Instructions (Signed)
Come to the MAU (maternity admission unit) for 1) Strong contractions every 2-3 minutes for at least 1 hour that do not go away when you drink water or take a warm shower. These contractions will be so strong all you can do is breath through them 2) Vaginal bleeding- anything more than spotting 3) Loss of fluid like you broke your water 4) Decreased movement of your baby  

## 2022-09-11 NOTE — Progress Notes (Signed)
   PRENATAL VISIT NOTE  Subjective:  Amy Nguyen is a 22 y.o. G1P0 at [redacted]w[redacted]d being seen today for ongoing prenatal care.  She is currently monitored for the following issues for this low-risk pregnancy and has Encounter for supervision of normal first pregnancy in second trimester; GBS bacteriuria; and COVID-19 affecting pregnancy in third trimester on their problem list.  Patient reports no complaints.  Contractions: Not present. Vag. Bleeding: None.  Movement: Present. Denies leaking of fluid.   The following portions of the patient's history were reviewed and updated as appropriate: allergies, current medications, past family history, past medical history, past social history, past surgical history and problem list.   Objective:   Vitals:   09/11/22 1555  BP: 124/78  Pulse: 92  Weight: 197 lb 1.6 oz (89.4 kg)    Fetal Status: Fetal Heart Rate (bpm): 160 Fundal Height: 39 cm Movement: Present  Presentation: Vertex  General:  Alert, oriented and cooperative. Patient is in no acute distress.  Skin: Skin is warm and dry. No rash noted.   Cardiovascular: Normal heart rate noted  Respiratory: Normal respiratory effort, no problems with respiration noted  Abdomen: Soft, gravid, appropriate for gestational age.  Pain/Pressure: Absent     Pelvic: Cervical exam performed in the presence of a chaperone Dilation: 1 Effacement (%): 50 Station: -3. Thick white discharge in the vaginal vault. No pooling and fern negative.   Extremities: Normal range of motion.     Mental Status: Normal mood and affect. Normal behavior. Normal judgment and thought content.   Assessment and Plan:  Pregnancy: G1P0 at [redacted]w[redacted]d 1. Encounter for supervision of normal first pregnancy in second trimester Fern neg today Likely normal discharge Plan for visit in 1 week Given labor precautions Instructed to go to MAU for leaking continues Consider sweeping at next visit.   2. GBS bacteriuria PCN in  labor  Preterm labor symptoms and general obstetric precautions including but not limited to vaginal bleeding, contractions, leaking of fluid and fetal movement were reviewed in detail with the patient. Please refer to After Visit Summary for other counseling recommendations.   Return in about 1 week (around 09/18/2022) for Routine prenatal care, [facility]Baby [facility].  Future Appointments  Date Time Provider [place]  09/19/2022  1:55 PM Clarnce Flock, MD [facility]  09/23/2022  2:35 PM Caren Macadam, MD [facility]  09/23/2022  3:15 PM [facility]  09/27/2022  7:00 AM [facility] SCHED ROOM [facility] None    Caren Macadam, MD

## 2022-09-12 ENCOUNTER — Encounter: Payer: Self-pay | Admitting: Family Medicine

## 2022-09-13 ENCOUNTER — Encounter (HOSPITAL_COMMUNITY): Payer: Self-pay | Admitting: Obstetrics & Gynecology

## 2022-09-13 ENCOUNTER — Inpatient Hospital Stay (HOSPITAL_COMMUNITY)
Admission: AD | Admit: 2022-09-13 | Discharge: 2022-09-13 | Disposition: A | Discharge status: Home / Self Care | Payer: Medicaid Other | Attending: Obstetrics & Gynecology | Admitting: Obstetrics & Gynecology

## 2022-09-13 DIAGNOSIS — Z3A39 39 weeks gestation of pregnancy: Secondary | ICD-10-CM | POA: Diagnosis not present

## 2022-09-13 DIAGNOSIS — O212 Late vomiting of pregnancy: Secondary | ICD-10-CM | POA: Insufficient documentation

## 2022-09-13 DIAGNOSIS — R112 Nausea with vomiting, unspecified: Secondary | ICD-10-CM | POA: Diagnosis present

## 2022-09-13 DIAGNOSIS — O479 False labor, unspecified: Secondary | ICD-10-CM

## 2022-09-13 MED ORDER — ONDANSETRON 4 MG PO TBDP
4.0000 mg | ORAL_TABLET | Freq: Once | ORAL | Status: AC
  Administered 2022-09-13: 4 mg via ORAL
  Filled 2022-09-13: qty 1

## 2022-09-13 NOTE — MAU Note (Signed)
.  Amy Nguyen is a 22 y.o. at [redacted]w[redacted]d here in MAU reporting: CTX since 0130 with nausea and vomiting. Pt denies VB, LOF, DFM, recent intercourse, PIH s/s, and complications in the pregnancy. SVE 1 cm Weds GBS pos HSV denies  Onset of complaint: 0130 Pain score: 10/10 Vitals:   09/13/22 0359  BP: 125/81  Pulse: 95  Resp: 18  Temp: 97.6 F (36.4 C)  SpO2: 100%     FHT:140 Lab orders placed from triage:

## 2022-09-19 ENCOUNTER — Other Ambulatory Visit: Payer: Self-pay

## 2022-09-19 ENCOUNTER — Encounter: Payer: Self-pay | Admitting: Family Medicine

## 2022-09-19 ENCOUNTER — Ambulatory Visit (INDEPENDENT_AMBULATORY_CARE_PROVIDER_SITE_OTHER): Payer: Medicaid Other | Admitting: Family Medicine

## 2022-09-19 VITALS — BP 129/60 | HR 95 | Wt 199.4 lb

## 2022-09-19 DIAGNOSIS — Z3403 Encounter for supervision of normal first pregnancy, third trimester: Secondary | ICD-10-CM | POA: Diagnosis not present

## 2022-09-19 DIAGNOSIS — Z3A39 39 weeks gestation of pregnancy: Secondary | ICD-10-CM

## 2022-09-19 DIAGNOSIS — R8271 Bacteriuria: Secondary | ICD-10-CM | POA: Diagnosis not present

## 2022-09-19 DIAGNOSIS — Z3402 Encounter for supervision of normal first pregnancy, second trimester: Secondary | ICD-10-CM

## 2022-09-19 NOTE — Patient Instructions (Signed)

## 2022-09-19 NOTE — Progress Notes (Signed)
   Subjective:  Amy Nguyen is a 22 y.o. G1P0 at [redacted]w[redacted]d being seen today for ongoing prenatal care.  She is currently monitored for the following issues for this low-risk pregnancy and has Encounter for supervision of normal first pregnancy in second trimester; GBS bacteriuria; and COVID-19 affecting pregnancy in third trimester on their problem list.  Patient reports no complaints.  Contractions: Irritability. Vag. Bleeding: None.  Movement: Present. Denies leaking of fluid.   The following portions of the patient's history were reviewed and updated as appropriate: allergies, current medications, past family history, past medical history, past social history, past surgical history and problem list. Problem list updated.  Objective:   Vitals:   09/19/22 1428  BP: 129/60  Pulse: 95  Weight: 199 lb 6.4 oz (90.4 kg)    Fetal Status: Fetal Heart Rate (bpm): 138   Movement: Present  Presentation: Vertex  General:  Alert, oriented and cooperative. Patient is in no acute distress.  Skin: Skin is warm and dry. No rash noted.   Cardiovascular: Normal heart rate noted  Respiratory: Normal respiratory effort, no problems with respiration noted  Abdomen: Soft, gravid, appropriate for gestational age. Pain/Pressure: Present     Pelvic: Vag. Bleeding: None     Cervical exam performed Dilation: 3 Effacement (%): 80 Station: -1  Extremities: Normal range of motion.     Mental Status: Normal mood and affect. Normal behavior. Normal judgment and thought content.   Urinalysis:      Assessment and Plan:  Pregnancy: G1P0 at [redacted]w[redacted]d  1. Encounter for supervision of normal first pregnancy in second trimester BP and FHR normal Cervix favorable, membranes stripped per request Has appt next week along w post dates testing Asked about natural labor IOL, discussed methods and I strongly discouraged use of castor oil  2. GBS bacteriuria Ppx in labor  Term labor symptoms and general obstetric  precautions including but not limited to vaginal bleeding, contractions, leaking of fluid and fetal movement were reviewed in detail with the patient. Please refer to After Visit Summary for other counseling recommendations.  Return in 1 week (on 09/26/2022) for [name] patient, ob visit.   Clarnce Flock, MD

## 2022-09-20 ENCOUNTER — Inpatient Hospital Stay (HOSPITAL_COMMUNITY)
Admission: AD | Admit: 2022-09-20 | Discharge: 2022-09-22 | DRG: 807 | Disposition: A | Discharge status: Home / Self Care | Payer: Medicaid Other | Attending: Obstetrics and Gynecology | Admitting: Obstetrics and Gynecology

## 2022-09-20 ENCOUNTER — Inpatient Hospital Stay (HOSPITAL_COMMUNITY): Payer: Medicaid Other | Admitting: Anesthesiology

## 2022-09-20 ENCOUNTER — Inpatient Hospital Stay (HOSPITAL_COMMUNITY): Payer: Medicaid Other

## 2022-09-20 ENCOUNTER — Other Ambulatory Visit (HOSPITAL_COMMUNITY): Payer: Self-pay | Admitting: Advanced Practice Midwife

## 2022-09-20 ENCOUNTER — Encounter (HOSPITAL_COMMUNITY): Payer: Self-pay | Admitting: Obstetrics & Gynecology

## 2022-09-20 DIAGNOSIS — O139 Gestational [pregnancy-induced] hypertension without significant proteinuria, unspecified trimester: Secondary | ICD-10-CM | POA: Clinically undetermined

## 2022-09-20 DIAGNOSIS — O368130 Decreased fetal movements, third trimester, not applicable or unspecified: Secondary | ICD-10-CM | POA: Diagnosis present

## 2022-09-20 DIAGNOSIS — O99892 Other specified diseases and conditions complicating childbirth: Secondary | ICD-10-CM | POA: Diagnosis not present

## 2022-09-20 DIAGNOSIS — O480 Post-term pregnancy: Principal | ICD-10-CM | POA: Diagnosis present

## 2022-09-20 DIAGNOSIS — O99824 Streptococcus B carrier state complicating childbirth: Secondary | ICD-10-CM | POA: Diagnosis present

## 2022-09-20 DIAGNOSIS — O365930 Maternal care for other known or suspected poor fetal growth, third trimester, not applicable or unspecified: Secondary | ICD-10-CM

## 2022-09-20 DIAGNOSIS — R8271 Bacteriuria: Secondary | ICD-10-CM | POA: Diagnosis present

## 2022-09-20 DIAGNOSIS — Z3A40 40 weeks gestation of pregnancy: Secondary | ICD-10-CM

## 2022-09-20 DIAGNOSIS — Z8616 Personal history of COVID-19: Secondary | ICD-10-CM

## 2022-09-20 DIAGNOSIS — M7989 Other specified soft tissue disorders: Secondary | ICD-10-CM | POA: Diagnosis not present

## 2022-09-20 DIAGNOSIS — Z3402 Encounter for supervision of normal first pregnancy, second trimester: Principal | ICD-10-CM

## 2022-09-20 DIAGNOSIS — Z3400 Encounter for supervision of normal first pregnancy, unspecified trimester: Secondary | ICD-10-CM | POA: Insufficient documentation

## 2022-09-20 DIAGNOSIS — O99820 Streptococcus B carrier state complicating pregnancy: Secondary | ICD-10-CM

## 2022-09-20 DIAGNOSIS — Z30017 Encounter for initial prescription of implantable subdermal contraceptive: Secondary | ICD-10-CM

## 2022-09-20 DIAGNOSIS — O134 Gestational [pregnancy-induced] hypertension without significant proteinuria, complicating childbirth: Secondary | ICD-10-CM | POA: Diagnosis present

## 2022-09-20 DIAGNOSIS — O26893 Other specified pregnancy related conditions, third trimester: Secondary | ICD-10-CM | POA: Diagnosis present

## 2022-09-20 LAB — CBC
HCT: 32 % — ABNORMAL LOW (ref 36.0–46.0)
Hemoglobin: 10.4 g/dL — ABNORMAL LOW (ref 12.0–15.0)
MCH: 28.3 pg (ref 26.0–34.0)
MCHC: 32.5 g/dL (ref 30.0–36.0)
MCV: 87 fL (ref 80.0–100.0)
Platelets: 292 10*3/uL (ref 150–400)
RBC: 3.68 MIL/uL — ABNORMAL LOW (ref 3.87–5.11)
RDW: 14.4 % (ref 11.5–15.5)
WBC: 11.8 10*3/uL — ABNORMAL HIGH (ref 4.0–10.5)
nRBC: 0 % (ref 0.0–0.2)

## 2022-09-20 LAB — COMPREHENSIVE METABOLIC PANEL
ALT: 8 U/L (ref 0–44)
AST: 22 U/L (ref 15–41)
Albumin: 2.5 g/dL — ABNORMAL LOW (ref 3.5–5.0)
Alkaline Phosphatase: 247 U/L — ABNORMAL HIGH (ref 38–126)
Anion gap: 7 (ref 5–15)
BUN: <5 mg/dL — ABNORMAL LOW (ref 6–20)
CO2: 21 mmol/L — ABNORMAL LOW (ref 22–32)
Calcium: 8.3 mg/dL — ABNORMAL LOW (ref 8.9–10.3)
Chloride: 107 mmol/L (ref 98–111)
Creatinine, Ser: 0.58 mg/dL (ref 0.44–1.00)
GFR, Estimated: >60 mL/min (ref >60)
Glucose, Bld: 86 mg/dL (ref 70–99)
Potassium: 4.2 mmol/L (ref 3.5–5.1)
Sodium: 135 mmol/L (ref 135–145)
Total Bilirubin: 0.2 mg/dL — ABNORMAL LOW (ref 0.3–1.2)
Total Protein: 6 g/dL — ABNORMAL LOW (ref 6.5–8.1)

## 2022-09-20 LAB — TYPE AND SCREEN
ABO/RH(D): A POS
Antibody Screen: NEGATIVE

## 2022-09-20 LAB — RPR: RPR Ser Ql: NONREACTIVE

## 2022-09-20 LAB — PROTEIN / CREATININE RATIO, URINE
Creatinine, Urine: 21 mg/dL
Total Protein, Urine: <6 mg/dL

## 2022-09-20 MED ORDER — PENICILLIN G POT IN DEXTROSE 60000 UNIT/ML IV SOLN
3.0000 10*6.[IU] | INTRAVENOUS | Status: DC
  Administered 2022-09-20 (×3): 3 10*6.[IU] via INTRAVENOUS
  Filled 2022-09-20 (×3): qty 50

## 2022-09-20 MED ORDER — LIDOCAINE-EPINEPHRINE (PF) 2 %-1:200000 IJ SOLN
INTRAMUSCULAR | Status: DC | PRN
  Administered 2022-09-20: 3 mL via EPIDURAL

## 2022-09-20 MED ORDER — SODIUM CHLORIDE 0.9 % IV SOLN
5.0000 10*6.[IU] | Freq: Once | INTRAVENOUS | Status: AC
  Administered 2022-09-20: 5 10*6.[IU] via INTRAVENOUS
  Filled 2022-09-20: qty 5

## 2022-09-20 MED ORDER — SOD CITRATE-CITRIC ACID 500-334 MG/5ML PO SOLN: 30.0000 mL | ORAL | Status: DC | PRN

## 2022-09-20 MED ORDER — FENTANYL-BUPIVACAINE-NACL 0.5-0.125-0.9 MG/250ML-% EP SOLN
12.0000 mL/h | EPIDURAL | Status: DC | PRN
  Administered 2022-09-20: 12 mL/h via EPIDURAL
  Filled 2022-09-20 (×2): qty 250

## 2022-09-20 MED ORDER — EPHEDRINE 5 MG/ML INJ: 10.0000 mg | INTRAVENOUS | Status: DC | PRN

## 2022-09-20 MED ORDER — LACTATED RINGERS IV SOLN: 500.0000 mL | Freq: Once | INTRAVENOUS | Status: DC

## 2022-09-20 MED ORDER — LIDOCAINE HCL (PF) 1 % IJ SOLN: 30.0000 mL | INTRAMUSCULAR | Status: DC | PRN

## 2022-09-20 MED ORDER — FENTANYL CITRATE (PF) 100 MCG/2ML IJ SOLN
50.0000 ug | INTRAMUSCULAR | Status: DC | PRN
  Administered 2022-09-20 (×4): 100 ug via INTRAVENOUS
  Filled 2022-09-20 (×3): qty 2

## 2022-09-20 MED ORDER — DIPHENHYDRAMINE HCL 50 MG/ML IJ SOLN: 12.5000 mg | INTRAMUSCULAR | Status: DC | PRN

## 2022-09-20 MED ORDER — PHENYLEPHRINE 80 MCG/ML (10ML) SYRINGE FOR IV PUSH (FOR BLOOD PRESSURE SUPPORT): 80.0000 ug | PREFILLED_SYRINGE | INTRAVENOUS | Status: DC | PRN

## 2022-09-20 MED ORDER — OXYTOCIN BOLUS FROM INFUSION
333.0000 mL | Freq: Once | INTRAVENOUS | Status: AC
  Administered 2022-09-20: 333 mL via INTRAVENOUS

## 2022-09-20 MED ORDER — OXYTOCIN-SODIUM CHLORIDE 30-0.9 UT/500ML-% IV SOLN
2.5000 [IU]/h | INTRAVENOUS | Status: DC
  Administered 2022-09-20: 2.5 [IU]/h via INTRAVENOUS
  Filled 2022-09-20: qty 500

## 2022-09-20 MED ORDER — LACTATED RINGERS IV SOLN: INTRAVENOUS | Status: DC

## 2022-09-20 MED ORDER — ACETAMINOPHEN 325 MG PO TABS: 650.0000 mg | ORAL_TABLET | ORAL | Status: DC | PRN

## 2022-09-20 MED ORDER — LACTATED RINGERS IV SOLN: 500.0000 mL | INTRAVENOUS | Status: DC | PRN

## 2022-09-20 MED ORDER — ONDANSETRON HCL 4 MG/2ML IJ SOLN
4.0000 mg | Freq: Four times a day (QID) | INTRAMUSCULAR | Status: DC | PRN
  Administered 2022-09-20 (×2): 4 mg via INTRAVENOUS
  Filled 2022-09-20 (×2): qty 2

## 2022-09-20 NOTE — Anesthesia Procedure Notes (Signed)
Epidural Patient location during procedure: OB Start time: 09/20/2022 4:25 PM End time: 09/20/2022 4:44 PM  Staffing Anesthesiologist: Oleta Mouse, MD Performed: anesthesiologist   Preanesthetic Checklist Completed: patient identified, IV checked, risks and benefits discussed, monitors and equipment checked, pre-op evaluation and timeout performed  Epidural Patient position: sitting Prep: DuraPrep Patient monitoring: heart rate, continuous pulse ox and blood pressure Approach: midline Location: L4-L5 Injection technique: LOR saline  Needle:  Needle type: Tuohy  Needle gauge: 17 G Needle length: 9 cm Needle insertion depth: 8 cm Catheter type: closed end flexible Catheter size: 19 Gauge Catheter at skin depth: 13 cm Test dose: negative and 2% lidocaine with Epi 1:200 K  Assessment Events: blood not aspirated, no cerebrospinal fluid, injection not painful, no injection resistance, no paresthesia and negative IV test  Additional Notes Reason for block:procedure for pain

## 2022-09-20 NOTE — Progress Notes (Addendum)
Labor Progress Note  Amy Nguyen is a 22 y.o. G1P0 at [redacted]w[redacted]d presented for SOL  S: Shivering but not uncomfortable. Epidural in place.  O:  BP 128/70   Pulse 73   Temp 97.8 F (36.6 C) (Oral)   Resp 18   Ht 5\' 8"  (1.727 m)   Wt 89.8 kg   LMP 12/10/2021   SpO2 100%   BMI 30.11 kg/m  EFM: 125bpm/Moderate variability/ 15x15 accels/ None decels  CVE: 6/90/-1   A&P: 22 y.o. G1P0 [redacted]w[redacted]d  here for SOL as above  #Labor: Progressing well. AROM with slight mec and fetal head well applied to cervix. Continue to monitor progress and consider pitocin if labor stalls #Pain: Epidural #FWB: CAT 1 #GBS positive - on PCN  Colletta Maryland, MD 09/20/22 5:24 PM    Midwife attestation I agree with the documentation in the resident's note.   Julianne Handler, CNM 6:39 PM

## 2022-09-20 NOTE — Progress Notes (Signed)
S: Pt report pain and swelling in her right hand since last night. Reports falling down and catching herself with her hand.   O: Swelling to right thumb and thenar eminence. Flexion present at metacarpophalangeal but no flexion at the basal carpometacarpal.  A/P: Hand swelling Hand xray

## 2022-09-20 NOTE — MAU Note (Signed)
...  Amy Nguyen is a 22 y.o. at [redacted]w[redacted]d here in MAU reporting: CTX since before midnight that have increased in intensity and are now every five minutes. Endorses bloody show. Denies LOF. Has not felt any fetal movement since prior to midnight.   Fell last night onto her right hand. She reports it is hard to move her right thumb. Denies hitting abdomen. She reports she caught herself with her hand/arm.  GBS+. 380-1 yesterday in office. Had membranes swept.    Onset of complaint: Prior to midnight Pain score: 10/10 lower abdomen and lower back  FHT: 140 initial external Lab orders placed from triage:  MAU Labor Eval

## 2022-09-20 NOTE — H&P (Addendum)
OBSTETRIC ADMISSION HISTORY AND PHYSICAL  Amy Nguyen is a 22 y.o. female G1P0 with IUP at [redacted]w[redacted]d by early [place] presenting for SOL. She reports +FMs but decreased since midnight, No LOF, no VB, no blurry vision, headaches or peripheral edema, and RUQ pain.  She plans on breast and bottle feeding. She request Nexplanon for birth control. She received her prenatal care at [facility]   Dating: By early [place] --->  Estimated Date of Delivery: 09/20/22  Sono: @[redacted]w[redacted]d , CWD, normal anatomy, cephalic presentation, 5176H, 22% EFW   Prenatal History/Complications:  FGR - resolved Teen pregnancy  Nursing Staff Provider  Office Location  [place] Dating  7wk [place]  [facility] Model [ ]  Traditional [ ]  Centering Valu.Nieves ] Mom-Baby [name]      Language  English Anatomy US  IUGR 7%>resolved  Flu Vaccine  05/22/2022 Genetic/Carrier Screen  NIPS:   LR female AFP:   normal Horizon: neg x4  TDaP Vaccine   07/17/22 Hgb A1C or  GTT Early - normal Third trimester - normal  COVID Vaccine No   LAB RESULTS   Rhogam  n/a Blood Type A/Positive/-- (08/28 0949)   Baby Feeding Plan Bottle/Breast Antibody Negative (08/28 0949)  Contraception Depo Rubella 4.77 (08/28 0949)  Circumcision NA RPR Non Reactive (08/28 0949)   Pediatrician  Mom/Baby HBsAg Negative (08/28 0949)   Support Person Isaiah (FOB) HCVAb neg  Prenatal Classes   HIV Non Reactive (08/28 0949)     BTL Consent NA GBS  +urine  VBAC Consent NA Pap n/a           DME Rx Valu.Nieves ] BP cuff [ ]  Weight Scale Waterbirth  [ ]  Class [ ]  Consent [ ]  CNM visit  PHQ9 & GAD7 [ x ] new OB [  ] 28 weeks  [ x ] 36 weeks Induction  [ ]  Orders Entered [ ] Foley Y/N    Past Medical History: Past Medical History:  Diagnosis Date   Medical history non-contributory     Past Surgical History: Past Surgical History:  Procedure Laterality Date   NO PAST SURGERIES      Obstetrical History: OB History     Gravida  1   Para      Term      Preterm      AB      Living          SAB      IAB      Ectopic      Multiple      Live Births              Social History Social History   Socioeconomic History   Marital status: Single    Spouse name: Not on file   Number of children: Not on file   Years of education: Not on file   Highest education level: Not on file  Occupational History   Not on file  Tobacco Use   Smoking status: Never   Smokeless tobacco: Never  Vaping Use   Vaping Use: Former   Substances: Nicotine, Flavoring  Substance and Sexual Activity   Alcohol use: Never   Drug use: Never   Sexual activity: Not Currently    Birth control/protection: None  Other Topics Concern   Not on file  Social History Narrative   Not on file   Social Determinants of Health   Financial Resource Strain: Not on file  Food Insecurity: Food Insecurity Present (09/05/2022)  Hunger Vital Sign    Worried About Running Out of Food in the Last Year: Sometimes true    Ran Out of Food in the Last Year: Sometimes true  Transportation Needs: No Transportation Needs (09/05/2022)   PRAPARE - Administrator, Civil Service (Medical): No    Lack of Transportation (Non-Medical): No  Physical Activity: Not on file  Stress: Not on file  Social Connections: Not on file    Family History: Family History  Problem Relation Age of Onset   Diabetes Mother    Hypertension Father     Allergies: No Known Allergies  Medications Prior to Admission  Medication Sig Dispense Refill Last Dose   ondansetron (ZOFRAN-ODT) 4 MG disintegrating tablet Take 1 tablet (4 mg total) by mouth every 8 (eight) hours as needed for nausea or vomiting. (Patient not taking: Reported on 09/19/2022) 30 tablet 0    Prenatal Vit-Fe Fumarate-FA (PRENATAL MULTIVITAMIN) TABS tablet Take 1 tablet by mouth daily at 12 noon.        Review of Systems   All systems reviewed and negative except as stated in HPI  Blood pressure 122/71, pulse 85, temperature 98.3 F (36.8 C),  temperature source Oral, resp. rate 15, height 5\' 8"  (1.727 m), weight 89.8 kg, last menstrual period 12/10/2021, SpO2 99 %. General appearance: alert, cooperative, and appears stated age Lungs: clear to auscultation bilaterally Heart: regular rate and rhythm Abdomen: soft, non-tender; bowel sounds normal Extremities: Homans sign is negative, no sign of DVT Presentation: cephalic Fetal monitoringBaseline: 135 bpm, Variability: Good {> 6 bpm), Accelerations: Reactive, and Decelerations: Absent Uterine activity: q2-4 minutes Dilation: 3.5 Effacement (%): 90 Station: -1 Exam by:: [email or web address], RN   Prenatal labs: ABO, Rh: --/--/PENDING (01/26 [birth year]1980) Antibody: PENDING (01/26 0926) Rubella: 4.77 (08/28 0949) RPR: Non Reactive (11/22 0851)  HBsAg: Negative (08/28 0949)  HIV: Non Reactive (11/22 0851)  GBS:    2 hr Glucola: normal (07/17/2022) Genetic screening: normal (04/22/2022) Anatomy 04/24/2022: normal (04/26/2022)  Prenatal Transfer Tool  Maternal Diabetes: No Genetic Screening: Normal Maternal Ultrasounds/Referrals: IUGR Fetal Ultrasounds or other Referrals:  Referred to Materal Fetal Medicine  Maternal Substance Abuse:  No Significant Maternal Medications:  None Significant Maternal Lab Results: Group B Strep positive  Results for orders placed or performed during the hospital encounter of 09/20/22 (from the past 24 hour(s))  Type and screen   Collection Time: 09/20/22  9:26 AM  Result Value Ref Range   ABO/RH(D) PENDING    Antibody Screen PENDING    Sample Expiration      09/23/2022,2359 Performed at [facility], [address], [place], [place]     Patient Active Problem List   Diagnosis Date Noted   Post-dates pregnancy 09/20/2022   COVID-19 affecting pregnancy in third trimester 09/03/2022   GBS bacteriuria 04/26/2022   Encounter for supervision of normal first pregnancy in second trimester 04/10/2022    Assessment/Plan:  Amy Nguyen is a 22  y.o. G1P0 at [redacted]w[redacted]d here for SOL  #Labor: Contractions becoming more uncomfortable. Monitor labor progression and AROM and/or pitocin as indicated. #Pain: Epidural upon request #FWB: Cat 1 #ID:  GBS+ on PCN #MOF: Both #MOC: Nexplanon #Circ:  N/A  [redacted]w[redacted]d, MD  09/20/2022, 9:47 AM    Midwife attestation: I have seen and examined this patient; I agree with above documentation in the resident's note.   ROS, labs, PMH reviewed  PE: Gen: calm comfortable, NAD Resp: normal effort and rate  Abd: gravid  Assessment/Plan: [redacted] weeks gestation Decreased fetal movement Labor: early FWB: Cat I GBS: pos Admit to LD Expectant mngt PCN Anticipate SVD  Julianne Handler, CNM  09/20/2022, 11:08 AM

## 2022-09-20 NOTE — Anesthesia Preprocedure Evaluation (Signed)
Anesthesia Evaluation  Patient identified by MRN, date of birth, ID band Patient awake    Reviewed: Allergy & Precautions, Patient's Chart, lab work & pertinent test results  History of Anesthesia Complications Negative for: history of anesthetic complications  Airway Mallampati: I  TM Distance: >3 FB Neck ROM: Full    Dental no notable dental hx.    Pulmonary neg pulmonary ROS   breath sounds clear to auscultation       Cardiovascular negative cardio ROS  Rhythm:Regular     Neuro/Psych negative neurological ROS  negative psych ROS   GI/Hepatic negative GI ROS, Neg liver ROS,,,  Endo/Other  negative endocrine ROS    Renal/GU negative Renal ROS     Musculoskeletal   Abdominal   Peds  Hematology negative hematology ROS (+) Lab Results      Component                Value               Date                      WBC                      11.8 (H)            09/20/2022                HGB                      10.4 (L)            09/20/2022                HCT                      32.0 (L)            09/20/2022                MCV                      87.0                09/20/2022                PLT                      292                 09/20/2022              Anesthesia Other Findings   Reproductive/Obstetrics (+) Pregnancy                             Anesthesia Physical Anesthesia Plan  ASA: 2  Anesthesia Plan: Epidural   Post-op Pain Management:    Induction:   PONV Risk Score and Plan: 2 and Treatment may vary due to age or medical condition  Airway Management Planned: Natural Airway  Additional Equipment: None  Intra-op Plan:   Post-operative Plan:   Informed Consent: I have reviewed the patients History and Physical, chart, labs and discussed the procedure including the risks, benefits and alternatives for the proposed anesthesia with the patient or authorized  representative who has indicated his/her understanding and acceptance.       Plan Discussed with:  Anesthesia Plan Comments:        Anesthesia Quick Evaluation

## 2022-09-20 NOTE — Progress Notes (Signed)
Patient ID: Amy Nguyen, female   DOB: August 06, 2001, 22 y.o.   MRN: [id number]  Shaking and nervous about pushing; feels some pressure but not an overwhelming urge  BP 140/78 @ 1730 w nl values since, but that gives mild range elevations >4h apart FHR 125-130, +accels, occ mi variables Ctx q 3 mins Cx C/C vtx +2  Neg pre-e labs earlier today  IUP@40 .0wks End 1st stage IP gHTN  -Will labor down a bit and then pushing when the urge increases -Hopefuly that the rest will allow her to collect herself and get used to the idea of pushing -Anticipate vag del  Myrtis Ser CNM 09/20/2022

## 2022-09-20 NOTE — Discharge Summary (Signed)
Postpartum Discharge Summary    Patient Name: Amy Nguyen DOB: 2000-09-01 MRN: [id number]  Date of admission: 09/20/2022 Delivery date:09/20/2022  Delivering provider: Serita Grammes D  Date of discharge: 09/22/2022  Admitting diagnosis: Post-dates pregnancy [O48.0] Intrauterine pregnancy: [redacted]w[redacted]d    Secondary diagnosis:  Principal Problem:   Vaginal delivery Active Problems:   GBS bacteriuria   Post-dates pregnancy   Gestational hypertension  Additional problems: none    Discharge diagnosis: Term Pregnancy Delivered and Gestational Hypertension                                              Post partum procedures: Nexplanon placement Augmentation: AROM Complications: None  Hospital course: Onset of Labor With Vaginal Delivery      22y.o. yo G1P0 at 434w0das admitted in Latent Labor on 09/20/2022. Labor course was complicated by diagnosis of gHTN intrapartum by mild range BP elevations without symptoms and neg pre-e labs.  Membrane Rupture Time/Date: 5:20 PM ,09/20/2022   Delivery Method:Vaginal, Spontaneous  Episiotomy: None  Lacerations:  None  Patient had a postpartum course complicated by none.  She is ambulating, tolerating a regular diet, passing flatus, and urinating well. Patient is discharged home in stable condition on 09/22/22.  Newborn Data: Birth date:09/20/2022  Birth time:11:14 PM  Gender:Female  Living status:Living  Apgars:9 ,9  Weight:3090 g (6lb 13oz)  Magnesium Sulfate received: No BMZ received: No Rhophylac:N/A MMR:N/A T-DaP:Given prenatally Flu: Yes Transfusion:No  Physical exam  Vitals:   09/21/22 1045 09/21/22 1409 09/21/22 2123 09/22/22 0504  BP: (!) 93/53 118/63 100/63 98/60  Pulse: 76 79 84 67  Resp: 18 18 18 18  $ Temp: 98.1 F (36.7 C) 98.4 F (36.9 C) 98 F (36.7 C) 98.1 F (36.7 C)  TempSrc: Oral Oral Axillary Oral  SpO2: 100% 100%    Weight:      Height:       General: alert, cooperative, and no distress Lochia:  appropriate Uterine Fundus: firm Incision: N/A DVT Evaluation: No evidence of DVT seen on physical exam. Labs: Lab Results  Component Value Date   WBC 11.8 (H) 09/20/2022   HGB 10.4 (L) 09/20/2022   HCT 32.0 (L) 09/20/2022   MCV 87.0 09/20/2022   PLT 292 09/20/2022      Latest Ref Rng & Units 09/20/2022    9:26 AM  CMP  Glucose 70 - 99 mg/dL 86   BUN 6 - 20 mg/dL <5   Creatinine 0.44 - 1.00 mg/dL 0.58   Sodium 135 - 145 mmol/L 135   Potassium 3.5 - 5.1 mmol/L 4.2   Chloride 98 - 111 mmol/L 107   CO2 22 - 32 mmol/L 21   Calcium 8.9 - 10.3 mg/dL 8.3   Total Protein 6.5 - 8.1 g/dL 6.0   Total Bilirubin 0.3 - 1.2 mg/dL 0.2   Alkaline Phos 38 - 126 U/L 247   AST 15 - 41 U/L 22   ALT 0 - 44 U/L 8    Edinburgh Score:    09/22/2022    1:38 AM  Edinburgh Postnatal Depression Scale Screening Tool  I have been able to laugh and see the funny side of things. 0  I have looked forward with enjoyment to things. 0  I have blamed myself unnecessarily when things went wrong. 1  I have been anxious or worried for no good  reason. 1  I have felt scared or panicky for no good reason. 0  Things have been getting on top of me. 1  I have been so unhappy that I have had difficulty sleeping. 0  I have felt sad or miserable. 1  I have been so unhappy that I have been crying. 0  The thought of harming myself has occurred to me. 0  Edinburgh Postnatal Depression Scale Total 4     After visit meds:  Allergies as of 09/22/2022       Reactions   No [employer] Food Allergies    Pork-derived Products    No pork        Medication List     TAKE these medications    acetaminophen 325 MG tablet Commonly known as: Tylenol Take 2 tablets (650 mg total) by mouth every 4 (four) hours as needed (for pain scale < 4).   benzocaine-Menthol 20-0.5 % Aero Commonly known as: DERMOPLAST Apply 1 Application topically as needed for irritation (perineal discomfort).   furosemide 20 MG  tablet Commonly known as: LASIX Take 1 tablet (20 mg total) by mouth daily for 3 days.   ibuprofen 600 MG tablet Commonly known as: ADVIL Take 1 tablet (600 mg total) by mouth every 6 (six) hours.   prenatal multivitamin Tabs tablet Take 1 tablet by mouth daily at 12 noon.   senna-docusate 8.6-50 MG tablet Commonly known as: Senokot-S Take 2 tablets by mouth daily.         Discharge home in stable condition Infant Feeding: Bottle and Breast Infant Disposition:home with mother Discharge instruction: per After Visit Summary and Postpartum booklet. Activity: Advance as tolerated. Pelvic rest for 6 weeks.  Diet: routine diet Future Appointments: Future Appointments  Date Time Provider [place]  09/23/2022  2:35 PM Caren Macadam, MD [facility]  09/23/2022  3:15 PM [facility]   Follow up Visit:  Myrtis Ser, CNM  P [facility] Baby [name] Admin This patient is a Mom+Baby Combined care infant. The patient and baby need the following appointments scheduled:  Infant needs a newborn weight check -- 2-3 days from discharge. Approximate date of d/c: 09/22/22 Infant needs a 2 week weight check Infant needs 1 month Well Child Check  Thank you!  Please schedule this patient for Postpartum visit in: 6 weeks with the following provider: Any provider In-Person For C/S patients schedule nurse incision check in weeks 2 weeks: no High risk pregnancy complicated by: intrapartum gHTN Delivery mode:  SVD Anticipated Birth Control:  PP Nexplanon placed (this is the plan) PP Procedures needed: 1wk RN BP check Edinburgh: unsure yet Schedule Integrated BH visit: no No relevant baby issues   09/22/2022 Shelda Pal, DO

## 2022-09-21 ENCOUNTER — Encounter (HOSPITAL_COMMUNITY): Payer: Self-pay | Admitting: Family Medicine

## 2022-09-21 MED ORDER — ONDANSETRON HCL 4 MG/2ML IJ SOLN: 4.0000 mg | INTRAMUSCULAR | Status: DC | PRN

## 2022-09-21 MED ORDER — IBUPROFEN 600 MG PO TABS
600.0000 mg | ORAL_TABLET | Freq: Four times a day (QID) | ORAL | Status: DC
  Administered 2022-09-21 – 2022-09-22 (×6): 600 mg via ORAL
  Filled 2022-09-21 (×6): qty 1

## 2022-09-21 MED ORDER — ZOLPIDEM TARTRATE 5 MG PO TABS: 5.0000 mg | ORAL_TABLET | Freq: Every evening | ORAL | Status: DC | PRN

## 2022-09-21 MED ORDER — ACETAMINOPHEN 325 MG PO TABS: 650.0000 mg | ORAL_TABLET | ORAL | Status: DC | PRN

## 2022-09-21 MED ORDER — PRENATAL MULTIVITAMIN CH
1.0000 | ORAL_TABLET | Freq: Every day | ORAL | Status: DC
  Administered 2022-09-21 – 2022-09-22 (×2): 1 via ORAL
  Filled 2022-09-21 (×2): qty 1

## 2022-09-21 MED ORDER — DIPHENHYDRAMINE HCL 25 MG PO CAPS: 25.0000 mg | ORAL_CAPSULE | Freq: Four times a day (QID) | ORAL | Status: DC | PRN

## 2022-09-21 MED ORDER — ETONOGESTREL 68 MG ~~LOC~~ IMPL
68.0000 mg | DRUG_IMPLANT | Freq: Once | SUBCUTANEOUS | Status: AC
  Administered 2022-09-22: 68 mg via SUBCUTANEOUS
  Filled 2022-09-21: qty 1

## 2022-09-21 MED ORDER — WITCH HAZEL-GLYCERIN EX PADS: 1.0000 | MEDICATED_PAD | CUTANEOUS | Status: DC | PRN

## 2022-09-21 MED ORDER — LIDOCAINE HCL 1 % IJ SOLN
0.0000 mL | Freq: Once | INTRAMUSCULAR | Status: DC | PRN
  Filled 2022-09-21: qty 20

## 2022-09-21 MED ORDER — DIBUCAINE (PERIANAL) 1 % EX OINT: 1.0000 | TOPICAL_OINTMENT | CUTANEOUS | Status: DC | PRN

## 2022-09-21 MED ORDER — SIMETHICONE 80 MG PO CHEW: 80.0000 mg | CHEWABLE_TABLET | ORAL | Status: DC | PRN

## 2022-09-21 MED ORDER — FUROSEMIDE 20 MG PO TABS
20.0000 mg | ORAL_TABLET | Freq: Every day | ORAL | Status: DC
  Administered 2022-09-21 – 2022-09-22 (×2): 20 mg via ORAL
  Filled 2022-09-21 (×2): qty 1

## 2022-09-21 MED ORDER — FUROSEMIDE 20 MG PO TABS: 20.0000 mg | ORAL_TABLET | Freq: Every day | ORAL | Status: DC

## 2022-09-21 MED ORDER — SENNOSIDES-DOCUSATE SODIUM 8.6-50 MG PO TABS
2.0000 | ORAL_TABLET | Freq: Every day | ORAL | Status: DC
  Administered 2022-09-21 – 2022-09-22 (×2): 2 via ORAL
  Filled 2022-09-21 (×3): qty 2

## 2022-09-21 MED ORDER — COCONUT OIL OIL: 1.0000 | TOPICAL_OIL | Status: DC | PRN

## 2022-09-21 MED ORDER — BENZOCAINE-MENTHOL 20-0.5 % EX AERO
1.0000 | INHALATION_SPRAY | CUTANEOUS | Status: DC | PRN
  Administered 2022-09-21: 1 via TOPICAL
  Filled 2022-09-21 (×2): qty 56

## 2022-09-21 MED ORDER — TETANUS-DIPHTH-ACELL PERTUSSIS 5-2.5-18.5 LF-MCG/0.5 IM SUSY: 0.5000 mL | PREFILLED_SYRINGE | Freq: Once | INTRAMUSCULAR | Status: DC

## 2022-09-21 MED ORDER — ONDANSETRON HCL 4 MG PO TABS: 4.0000 mg | ORAL_TABLET | ORAL | Status: DC | PRN

## 2022-09-21 NOTE — Progress Notes (Addendum)
POSTPARTUM PROGRESS NOTE  Post Partum Day 1  Subjective:  Amy Nguyen is a 22 y.o. G1P1001 s/p NSVD at [redacted]w[redacted]d.  No acute events overnight.  Pt denies problems with ambulating, voiding or po intake.  She denies nausea or vomiting.  Pain is well controlled.  She has had flatus. She has not had bowel movement.  Lochia Minimal.   Objective: Blood pressure 103/63, pulse 73, temperature 98.4 F (36.9 C), temperature source Oral, resp. rate 18, height 5\' 8"  (1.727 m), weight 89.8 kg, last menstrual period 12/10/2021, SpO2 97 %, unknown if currently breastfeeding.  Physical Exam:  General: alert, cooperative and no distress Chest: no respiratory distress Heart:regular rate, distal pulses intact Abdomen: soft, nontender,  Uterine Fundus: firm, appropriately tender DVT Evaluation: No calf swelling or tenderness Extremities: No edema Skin: warm, dry  Recent Labs    09/20/22 0927  HGB 10.4*  HCT 32.0*    Assessment/Plan: Amy Nguyen is a 22 y.o. G1P1001 s/p NSVD at [redacted]w[redacted]d   PPD#1 - Doing well Contraception: Nexplanon Feeding: breast and bottle Dispo: Plan for discharge  tomorrow. Gestational HTN: lasix 20mg  at 1 of 5 days   LOS: 1 day   Abram Sander, MD PGY1 09/21/2022, 7:36 AM   CNM attestation Post Partum Day #1 I have seen and examined this patient and agree with above documentation in the resident's note.   Amy Nguyen is a 22 y.o. G1P1001 s/p vag del.  Pt denies problems with ambulating, voiding or po intake. Pain is well controlled.  Plan for birth control is  Nexplanon to be placed today .  Method of Feeding: breast  PE:  BP 103/63   Pulse 73   Temp 98.4 F (36.9 C) (Oral)   Resp 18   Ht 5\' 8"  (1.727 m)   Wt 89.8 kg   LMP 12/10/2021   SpO2 97%   Breastfeeding Unknown   BMI 30.11 kg/m  Fundus firm  Plan for discharge:  Nexplanon to be placed later today Anticipate d/c tomorrow 09/22/22  Myrtis Ser, CNM 7:56 AM  09/21/2022

## 2022-09-21 NOTE — Procedures (Incomplete)
  Procedure Note: Nexplanon insertion  Patient is a 22 y.o. G1P1001 now PPD# 1 from NSVD. She desires long-term reversible contraception.  Risks/benefits/side effects of Nexplanon have been discussed with patient and her questions have been answered. Patient is aware of the common side effect of irregular bleeding, which the incidence of decreases over time.  BP 103/63   Pulse 73   Temp 98.4 F (36.9 C) (Oral)   Resp 18   Ht 5\' 8"  (1.727 m)   Wt 89.8 kg   LMP 12/10/2021   SpO2 97%   Breastfeeding Unknown   BMI 30.11 kg/m   Results for orders placed or performed during the hospital encounter of 09/20/22 (from the past 24 hour(s))  Protein / creatinine ratio, urine   Collection Time: 09/20/22 12:07 PM  Result Value Ref Range   Creatinine, Urine 21 mg/dL   Total Protein, Urine <6 mg/dL   Protein Creatinine Ratio        0.00 - 0.15 mg/mg[Cre]     She is {RIGHT/LEFT:20294}-handed, so her {RIGHT/LEFT:20294} arm, approximately 10 cm proximal from the elbow, was cleansed with alcohol and anesthetized with 2 mL of 1% Lidocaine with Epi.  The area was cleansed again with betadine and the Nexplanon was inserted per manufacturer's recommendations without difficulty.  A pressure bandage were applied.  Pt was instructed to keep the area clean and dry, remove pressure bandage in 24 hours.  She was given a card indicating date Nexplanon was inserted and date it needs to be removed. Follow-up PRN problems.  Janyth Pupa, DO Attending [place], [facility] for [employer], [place]

## 2022-09-21 NOTE — Anesthesia Postprocedure Evaluation (Signed)
Anesthesia Post Note  Patient: Amy Nguyen  Procedure(s) Performed: AN AD HOC LABOR EPIDURAL     Patient location during evaluation: Mother Baby Anesthesia Type: Epidural Level of consciousness: awake and alert Pain management: pain level controlled Vital Signs Assessment: post-procedure vital signs reviewed and stable Respiratory status: spontaneous breathing, nonlabored ventilation and respiratory function stable Cardiovascular status: stable Postop Assessment: no headache, no backache and epidural receding Anesthetic complications: no   No notable events documented.  Last Vitals:  Vitals:   09/21/22 0223 09/21/22 0559  BP: 125/68 103/63  Pulse: 97 73  Resp: 18 18  Temp: 37.3 C 36.9 C  SpO2: 97% 97%    Last Pain:  Vitals:   09/21/22 0559  TempSrc: Oral  PainSc:    Pain Goal:                Epidural/Spinal Function Cutaneous sensation: Normal sensation (09/21/22 0829)  Rayvon Char

## 2022-09-21 NOTE — Lactation Note (Signed)
This note was copied from a baby's chart. Lactation Consultation Note  Patient Name: Girl Porchea Charrier [id number] Date: 09/21/2022 Reason for consult: Initial assessment;Primapara;Term Age:20 hours Assisted baby to the breast. Audible swallows heard during BF. Notable softening of breast while BF.  Newborn feeding habits, behavior, STS, I&O, body alignment, positioning, support, supply and demand reviewed. Mom encouraged to feed baby 8-12 times/24 hours and with feeding cues.  Encouraged to call if needs assistance. Encouraged to BF before giving formula. Mom's feeding choice is BF/formula feeding.  Maternal Data Has patient been taught Hand Expression?: Yes Does the patient have breastfeeding experience prior to this delivery?: No  Feeding    LATCH Score Latch: Grasps breast easily, tongue down, lips flanged, rhythmical sucking.  Audible Swallowing: Spontaneous and intermittent  Type of Nipple: Everted at rest and after stimulation  Comfort (Breast/Nipple): Soft / non-tender  Hold (Positioning): Assistance needed to correctly position infant at breast and maintain latch.  LATCH Score: 9   Lactation Tools Discussed/Used    Interventions Interventions: Breast feeding basics reviewed;Adjust position;Assisted with latch;Support pillows;Skin to skin;Position options;Breast massage;Hand express;LC Services brochure;Breast compression  Discharge    Consult Status Consult Status: Follow-up Date: 09/22/22 Follow-up type: In-patient    Theodoro Kalata 09/21/2022, 5:14 AM

## 2022-09-21 NOTE — Lactation Note (Signed)
This note was copied from a baby's chart. Lactation Consultation Note  Patient Name: Amy Nguyen [id number] Date: 09/21/2022 Reason for consult: Follow-up assessment;Term;Primapara;1st time breastfeeding Age:21 hours   LC Note:  Scanned and verified that the [facility] pump referral form was received; awaiting arrival to the M/B unit.   Maternal Data Has patient been taught Hand Expression?: Yes Does the patient have breastfeeding experience prior to this delivery?: No  Feeding Mother's Current Feeding Choice: Breast Milk and Formula  LATCH Score Latch: Grasps breast easily, tongue down, lips flanged, rhythmical sucking.  Audible Swallowing: None  Type of Nipple: Everted at rest and after stimulation  Comfort (Breast/Nipple): Soft / non-tender  Hold (Positioning): Assistance needed to correctly position infant at breast and maintain latch.  LATCH Score: 7   Lactation Tools Discussed/Used    Interventions Interventions: Breast feeding basics reviewed;Assisted with latch;Skin to skin;Breast massage;Hand express;Breast compression;Position options;Support pillows;Adjust position;Education  Discharge Pump: [facility] Pump (Sent a [employer] pump referral) WIC Program: No (Mother is [facility] to [facility] and has an appointment)  Consult Status Consult Status: Follow-up Date: 09/22/22 Follow-up type: In-patient    [name] 09/21/2022, 2:59 PM

## 2022-09-21 NOTE — Lactation Note (Signed)
This note was copied from a baby's chart. Lactation Consultation Note  Patient Name: Amy Nguyen Date: 09/21/2022 Reason for consult: Follow-up assessment;Term;Primapara;1st time breastfeeding Age:20 hours   P1: Term infant at 40+0 weeks Feeding preference: Breast and formula Weight loss: 0%  Arrived to find "Amy Nguyen" asleep in the bassinet.  She has not awakened to feed in almost 4 hours.  Offered to assist with waking and latching; mother receptive.  Reviewed hand expression and finger fed drops to "Amy Nguyen."  Assisted to latch easily in the football hold.  With gentle stimulation, "Amy Nguyen" began to actively feed.  She had a wide gape, flanged lips and good jaw extensions.  Mother denied pain with feeding.  Observed her feeding well for 10 minutes before she became too tired to continue.  Placed her STS and she fell asleep.  Breast feeding basics reviewed.  Encouraged to awaken baby by three hours if she does not self awaken. Continue lots of STS, breast massage and hand expression; feeding back any expressed drops to "Amy Nguyen."  Mother has an appointment with the [facility] department for next week.  Stork pump referral sent with mother's permission.  Will have RN follow up after I leave to be sure pump arrives.  Father at bedside.  Parents receptive to teaching.   Maternal Data Has patient been taught Hand Expression?: Yes Does the patient have breastfeeding experience prior to this delivery?: No  Feeding Mother's Current Feeding Choice: Breast Milk and Formula  LATCH Score Latch: Grasps breast easily, tongue down, lips flanged, rhythmical sucking.  Audible Swallowing: None  Type of Nipple: Everted at rest and after stimulation  Comfort (Breast/Nipple): Soft / non-tender  Hold (Positioning): Assistance needed to correctly position infant at breast and maintain latch.  LATCH Score: 7   Lactation Tools Discussed/Used    Interventions Interventions: Breast feeding basics  reviewed;Assisted with latch;Skin to skin;Breast massage;Hand express;Breast compression;Position options;Support pillows;Adjust position;Education  Discharge Pump: Stork Pump (Sent a Stork pump referral) WIC Program: No (Mother is applying to [facility] and has an appointment)  Consult Status Consult Status: Follow-up Date: 09/22/22 Follow-up type: In-patient    Amy Nguyen 09/21/2022, 2:45 PM

## 2022-09-22 ENCOUNTER — Other Ambulatory Visit: Payer: Self-pay

## 2022-09-22 MED ORDER — BENZOCAINE-MENTHOL 20-0.5 % EX AERO: 1.0000 | INHALATION_SPRAY | CUTANEOUS | 0 refills | Status: DC | PRN

## 2022-09-22 MED ORDER — FUROSEMIDE 20 MG PO TABS: 20.0000 mg | ORAL_TABLET | Freq: Every day | ORAL | 0 refills | Status: DC

## 2022-09-22 MED ORDER — SENNOSIDES-DOCUSATE SODIUM 8.6-50 MG PO TABS: 2.0000 | ORAL_TABLET | Freq: Every day | ORAL | 0 refills | Status: DC

## 2022-09-22 MED ORDER — IBUPROFEN 600 MG PO TABS: 600.0000 mg | ORAL_TABLET | Freq: Four times a day (QID) | ORAL | 0 refills | Status: DC

## 2022-09-22 MED ORDER — ACETAMINOPHEN 325 MG PO TABS: 650.0000 mg | ORAL_TABLET | ORAL | 0 refills | Status: AC | PRN

## 2022-09-22 NOTE — Plan of Care (Signed)
  Problem: Role Relationship: Goal: Will demonstrate positive interactions with the child Outcome: Completed/Met   Problem: Safety: Goal: Risk of complications during labor and delivery will decrease Outcome: Completed/Met   Problem: Safety: Goal: Risk of complications during labor and delivery will decrease Outcome: Completed/Met   Problem: Health Behavior/Discharge Planning: Goal: Ability to manage health-related needs will improve Outcome: Completed/Met   Problem: Clinical Measurements: Goal: Cardiovascular complication will be avoided Outcome: Completed/Met   Problem: Coping: Goal: Level of anxiety will decrease Outcome: Completed/Met   Problem: Education: Goal: Individualized Educational Video(s) Outcome: Completed/Met Goal: Individualized Newborn Educational Video(s) Outcome: Completed/Met  Problem: Coping: Goal: Ability to identify and utilize available resources and services will improve Outcome: Completed/Met   Problem: Education: Goal: Knowledge of condition will improve Outcome: Completed/Met Goal: Individualized Educational Video(s) Outcome: Completed/Met Goal: Individualized Newborn Educational Video(s) Outcome: Completed/Met   Problem: Activity: Goal: Will verbalize the importance of balancing activity with adequate rest periods Outcome: Completed/Met Goal: Ability to tolerate increased activity will improve Outcome: Completed/Met   Problem: Coping: Goal: Ability to identify and utilize available resources and services will improve Outcome: Completed/Met   Problem: Life Cycle: Goal: Chance of risk for complications during the postpartum period will decrease Outcome: Completed/Met   Problem: Role Relationship: Goal: Ability to demonstrate positive interaction with newborn will improve Outcome: Completed/Met   Problem: Skin Integrity: Goal: Demonstration of wound healing without infection will improve Outcome: Completed/Met   Problem:  Education: Goal: Knowledge of condition will improve Outcome: Completed/Met Goal: Individualized Educational Video(s) Outcome: Completed/Met Goal: Individualized Newborn Educational Video(s) Outcome: Completed/Met

## 2022-09-22 NOTE — Procedures (Signed)
  Procedure Note: Nexplanon insertion  Patient is a 22 y.o. G1P1001 now PPD# 2 from [facility]. She desires long-term reversible contraception.  Risks/benefits/side effects of Nexplanon have been discussed with patient and her questions have been answered. Patient is aware of the common side effect of irregular bleeding, which the incidence of decreases over time.  BP 98/60   Pulse 67   Temp 98.1 F (36.7 C) (Oral)   Resp 18   Ht 5\' 8"  (1.727 m)   Wt 89.8 kg   LMP 12/10/2021   SpO2 100%   Breastfeeding Unknown   BMI 30.11 kg/m   No results found for this or any previous visit (from the past 24 hour(s)).   She is right-handed, so her left arm, approximately 10 cm proximal from the elbow, was cleansed with alcohol and anesthetized with 2 mL of 1% Lidocaine with Epi.  The area was cleansed again with betadine and the Nexplanon was inserted per manufacturer's recommendations without difficulty.  A pressure bandage were applied.  Pt was instructed to keep the area clean and dry, remove pressure bandage in 24 hours.  She was given a card indicating date Nexplanon was inserted and date it needs to be removed. Follow-up PRN problems.  Janyth Pupa, DO Attending [place], [facility] for [employer], [place]

## 2022-09-22 NOTE — Plan of Care (Signed)
  Problem: Education: Goal: Knowledge of General Education information will improve Description: Including pain rating scale, medication(s)/side effects and non-pharmacologic comfort measures Outcome: Completed/Met   Problem: Clinical Measurements: Goal: Ability to maintain clinical measurements within normal limits will improve Outcome: Completed/Met Goal: Will remain free from infection Outcome: Completed/Met Goal: Diagnostic test results will improve Outcome: Completed/Met Goal: Respiratory complications will improve Outcome: Completed/Met   Problem: Activity: Goal: Risk for activity intolerance will decrease Outcome: Completed/Met   Problem: Nutrition: Goal: Adequate nutrition will be maintained Outcome: Completed/Met   Problem: Elimination: Goal: Will not experience complications related to bowel motility Outcome: Completed/Met Goal: Will not experience complications related to urinary retention Outcome: Completed/Met   Problem: Pain Managment: Goal: General experience of comfort will improve Outcome: Completed/Met   Problem: Safety: Goal: Ability to remain free from injury will improve Outcome: Completed/Met   Problem: Skin Integrity: Goal: Risk for impaired skin integrity will decrease Outcome: Completed/Met   Problem: Education: Goal: Knowledge of condition will improve Outcome: Completed/Met   Problem: Activity: Goal: Will verbalize the importance of balancing activity with adequate rest periods Outcome: Completed/Met Goal: Ability to tolerate increased activity will improve Outcome: Completed/Met   Problem: Life Cycle: Goal: Chance of risk for complications during the postpartum period will decrease Outcome: Completed/Met   Problem: Role Relationship: Goal: Ability to demonstrate positive interaction with newborn will improve Outcome: Completed/Met   Problem: Skin Integrity: Goal: Demonstration of wound healing without infection will  improve Outcome: Completed/Met

## 2022-09-23 ENCOUNTER — Encounter: Payer: Medicaid Other | Admitting: Family Medicine

## 2022-09-23 ENCOUNTER — Other Ambulatory Visit: Payer: Self-pay

## 2022-09-27 ENCOUNTER — Ambulatory Visit: Payer: Medicaid Other

## 2022-09-27 ENCOUNTER — Inpatient Hospital Stay (HOSPITAL_COMMUNITY): Payer: Medicaid Other

## 2022-09-27 ENCOUNTER — Inpatient Hospital Stay (HOSPITAL_COMMUNITY): Admission: RE | Admit: 2022-09-27 | Payer: Medicaid Other | Source: Home / Self Care | Admitting: Family Medicine

## 2022-09-28 ENCOUNTER — Telehealth (HOSPITAL_COMMUNITY): Payer: Self-pay

## 2022-09-28 NOTE — Telephone Encounter (Signed)
No answer. No voicemail option available.  Sharyn [facility] 09/28/22,1829

## 2022-10-24 ENCOUNTER — Encounter: Payer: Self-pay | Admitting: Family Medicine

## 2022-10-24 ENCOUNTER — Ambulatory Visit (INDEPENDENT_AMBULATORY_CARE_PROVIDER_SITE_OTHER): Payer: Medicaid Other | Admitting: Family Medicine

## 2022-10-24 NOTE — Progress Notes (Signed)
[place] Partum Visit Note  Amy Nguyen is a 22 y.o. G36P1001 female who presents for a postpartum visit. She is 4 weeks postpartum following a normal spontaneous vaginal delivery.  I have fully reviewed the prenatal and intrapartum course. The delivery was at 40 gestational weeks.  Anesthesia: epidural. Postpartum course has been uneventful. Baby is doing well. Baby is feeding by breast. Bleeding staining only. Bowel function is normal. Bladder function is normal. Patient is sexually active. Contraception method is Nexplanon. Postpartum depression screening: negative.   Upstream - 10/24/22 1449       Pregnancy Intention Screening   Does the patient want to become pregnant in the next year? No    Does the patient's partner want to become pregnant in the next year? No    Would the patient like to discuss contraceptive options today? No      Contraception Wrap Up   Current Method Hormonal Implant    End Method Hormonal Implant    Contraception Counseling Provided No    How was the end contraceptive method provided? N/A            The pregnancy intention screening data noted above was reviewed. Potential methods of contraception were discussed. The patient elected to proceed with Hormonal Implant.   Edinburgh Postnatal Depression Scale - 10/24/22 1440       Edinburgh Postnatal Depression Scale:  In the Past 7 Days   I have been able to laugh and see the funny side of things. 0    I have looked forward with enjoyment to things. 0    I have blamed myself unnecessarily when things went wrong. 0    I have been anxious or worried for no good reason. 0    I have felt scared or panicky for no good reason. 0    Things have been getting on top of me. 0    I have been so unhappy that I have had difficulty sleeping. 0    I have felt sad or miserable. 0    I have been so unhappy that I have been crying. 0    The thought of harming myself has occurred to me. 0    Edinburgh Postnatal  Depression Scale Total 0             Health Maintenance Due  Topic Date Due   COVID-19 Vaccine (1) Never done   HPV VACCINES (1 - 2-dose series) Never done    The following portions of the patient's history were reviewed and updated as appropriate: allergies, current medications, past family history, past medical history, past social history, past surgical history, and problem list.  Review of Systems Pertinent items noted in HPI and remainder of comprehensive ROS otherwise negative.  Objective:  BP 126/83   Pulse 86   Ht '5\' 8"'$  (1.727 m)   Wt 173 lb 6.4 oz (78.7 kg)   LMP  (LMP Unknown)   Breastfeeding Yes   BMI 26.37 kg/m    General:  alert, cooperative, and appears stated age   Breasts:  not indicated  Lungs: Comfortalbe on room air  Wound N/a  GU exam:  not indicated        Assessment:    There are no diagnoses linked to this encounter.  Normal postpartum exam.   Plan:   Essential components of care per ACOG recommendations:  1.  Mood and well being: Patient with negative depression screening today. Reviewed local resources for support.  -  Patient tobacco use? No.   - hx of drug use? No.    2. Infant care and feeding:  -Patient currently breastmilk feeding? Yes. Reviewed importance of draining breast regularly to support lactation.  -Social determinants of health (SDOH) reviewed in EPIC. No concerns  3. Sexuality, contraception and birth spacing - Patient does not want a pregnancy in the next year.  Desired family size is 1 children.  - Reviewed reproductive life planning. Reviewed contraceptive methods based on pt preferences and effectiveness.  Patient desired Hormonal Implant which was previously placed in the hospital - Discussed birth spacing of 18 months  4. Sleep and fatigue -Encouraged family/partner/community support of 4 hrs of uninterrupted sleep to help with mood and fatigue  5. Physical Recovery  - Discussed patients delivery and  complications. She describes her labor as good. - Patient had a Vaginal, no problems at delivery. Patient had no laceration. Perineal healing reviewed. Patient expressed understanding - Patient has urinary incontinence? No. - Patient is safe to resume physical and sexual activity  6.  Health Maintenance - HM due items addressed No - up to date - Last pap smear No results found for: "DIAGPAP" Pap smear not done at today's visit.  -Breast Cancer screening indicated? No.   7. Chronic Disease/Pregnancy Condition follow up: Hypertension Gestational HTN - normotensive, not discharged on meds. Discussed increased lifetime risk of HTN and need for regular monitoring, as well as need for ASA prophylaxis in future pregnancies   Clarnce Flock, MD/MPH Attending Family Medicine Physician, [facility] for [facility], [place]

## 2022-12-04 ENCOUNTER — Encounter (HOSPITAL_COMMUNITY): Payer: Self-pay

## 2022-12-04 ENCOUNTER — Ambulatory Visit (HOSPITAL_COMMUNITY)
Admission: EM | Admit: 2022-12-04 | Discharge: 2022-12-04 | Disposition: A | Discharge status: Home / Self Care | Payer: Medicaid Other | Attending: Family Medicine | Admitting: Family Medicine

## 2022-12-04 DIAGNOSIS — Y939 Activity, unspecified: Secondary | ICD-10-CM | POA: Insufficient documentation

## 2022-12-04 DIAGNOSIS — Y929 Unspecified place or not applicable: Secondary | ICD-10-CM | POA: Diagnosis not present

## 2022-12-04 DIAGNOSIS — Z3202 Encounter for pregnancy test, result negative: Secondary | ICD-10-CM

## 2022-12-04 DIAGNOSIS — Z202 Contact with and (suspected) exposure to infections with a predominantly sexual mode of transmission: Secondary | ICD-10-CM

## 2022-12-04 DIAGNOSIS — S0921XA Traumatic rupture of right ear drum, initial encounter: Secondary | ICD-10-CM | POA: Diagnosis not present

## 2022-12-04 DIAGNOSIS — T7491XA Unspecified adult maltreatment, confirmed, initial encounter: Secondary | ICD-10-CM

## 2022-12-04 LAB — POCT URINALYSIS DIPSTICK, ED / UC
Bilirubin Urine: NEGATIVE
Glucose, UA: NEGATIVE mg/dL
Hgb urine dipstick: NEGATIVE
Ketones, ur: NEGATIVE mg/dL
Nitrite: NEGATIVE
Protein, ur: NEGATIVE mg/dL
Specific Gravity, Urine: 1.015 (ref 1.005–1.030)
Urobilinogen, UA: 0.2 mg/dL (ref 0.0–1.0)
pH: 7 (ref 5.0–8.0)

## 2022-12-04 LAB — HIV ANTIBODY (ROUTINE TESTING W REFLEX): HIV Screen 4th Generation wRfx: NONREACTIVE

## 2022-12-04 LAB — POC URINE PREG, ED: Preg Test, Ur: NEGATIVE

## 2022-12-04 MED ORDER — AMOXICILLIN-POT CLAVULANATE 875-125 MG PO TABS: 1.0000 | ORAL_TABLET | Freq: Two times a day (BID) | ORAL | 0 refills | Status: DC
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
## 2022-12-05 LAB — CERVICOVAGINAL ANCILLARY ONLY
Bacterial Vaginitis (gardnerella): NEGATIVE
Candida Glabrata: NEGATIVE
Candida Vaginitis: NEGATIVE
Chlamydia: NEGATIVE
Comment: NEGATIVE
Comment: NEGATIVE
Comment: NEGATIVE
Comment: NEGATIVE
Comment: NEGATIVE
Comment: NORMAL
Neisseria Gonorrhea: NEGATIVE
Trichomonas: NEGATIVE

## 2022-12-05 LAB — RPR: RPR Ser Ql: NONREACTIVE

## 2022-12-13 ENCOUNTER — Emergency Department (HOSPITAL_COMMUNITY)
Admission: EM | Admit: 2022-12-13 | Discharge: 2022-12-14 | Disposition: A | Discharge status: Home / Self Care | Payer: Medicaid Other | Attending: Emergency Medicine | Admitting: Emergency Medicine

## 2022-12-13 ENCOUNTER — Emergency Department (HOSPITAL_COMMUNITY): Payer: Medicaid Other

## 2022-12-13 ENCOUNTER — Encounter (HOSPITAL_COMMUNITY): Payer: Self-pay

## 2022-12-13 ENCOUNTER — Other Ambulatory Visit: Payer: Self-pay

## 2022-12-13 DIAGNOSIS — S0081XA Abrasion of other part of head, initial encounter: Secondary | ICD-10-CM | POA: Diagnosis not present

## 2022-12-13 DIAGNOSIS — S0083XA Contusion of other part of head, initial encounter: Secondary | ICD-10-CM | POA: Insufficient documentation

## 2022-12-13 DIAGNOSIS — S1083XA Contusion of other specified part of neck, initial encounter: Secondary | ICD-10-CM | POA: Insufficient documentation

## 2022-12-13 DIAGNOSIS — R519 Headache, unspecified: Secondary | ICD-10-CM | POA: Diagnosis present

## 2022-12-13 DIAGNOSIS — F419 Anxiety disorder, unspecified: Secondary | ICD-10-CM | POA: Diagnosis not present

## 2022-12-13 MED ORDER — LORAZEPAM 2 MG/ML IJ SOLN
1.0000 mg | Freq: Once | INTRAMUSCULAR | Status: AC
  Administered 2022-12-13: 1 mg via INTRAVENOUS
  Filled 2022-12-13: qty 1
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
## 2022-12-14 ENCOUNTER — Emergency Department (HOSPITAL_COMMUNITY): Payer: Medicaid Other

## 2022-12-14 MED ORDER — IBUPROFEN 800 MG PO TABS
800.0000 mg | ORAL_TABLET | Freq: Once | ORAL | Status: AC
  Administered 2022-12-14: 800 mg via ORAL
  Filled 2022-12-14: qty 1

## 2022-12-14 MED ORDER — IOHEXOL 350 MG/ML SOLN
75.0000 mL | Freq: Once | INTRAVENOUS | Status: AC | PRN
  Administered 2022-12-14: 75 mL via INTRAVENOUS

## 2022-12-14 MED ORDER — SODIUM CHLORIDE (PF) 0.9 % IJ SOLN
INTRAMUSCULAR | Status: AC
  Filled 2022-12-14: qty 50
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
## 2022-12-31 ENCOUNTER — Telehealth: Payer: Self-pay | Admitting: Family Medicine

## 2022-12-31 NOTE — Telephone Encounter (Signed)
Patient is bleeding after labor but would like to come in and see Dr. Crissie Reese but would like a nurse to call back

## 2022-12-31 NOTE — Telephone Encounter (Signed)
Call placed to pt. No answer, left VM and will send patient mychart message now.  Judeth Cornfield, RNC

## 2023-01-10 ENCOUNTER — Ambulatory Visit: Payer: Medicaid Other | Admitting: Family Medicine

## 2023-01-10 ENCOUNTER — Encounter: Payer: Self-pay | Admitting: Family Medicine

## 2023-01-10 VITALS — BP 119/78 | HR 92 | Ht 68.0 in | Wt 169.0 lb

## 2023-01-10 DIAGNOSIS — T7491XA Unspecified adult maltreatment, confirmed, initial encounter: Secondary | ICD-10-CM | POA: Insufficient documentation

## 2023-01-10 DIAGNOSIS — T7491XD Unspecified adult maltreatment, confirmed, subsequent encounter: Secondary | ICD-10-CM

## 2023-01-10 DIAGNOSIS — N921 Excessive and frequent menstruation with irregular cycle: Secondary | ICD-10-CM

## 2023-01-10 DIAGNOSIS — Z975 Presence of (intrauterine) contraceptive device: Secondary | ICD-10-CM | POA: Insufficient documentation

## 2023-01-10 DIAGNOSIS — B3789 Other sites of candidiasis: Secondary | ICD-10-CM | POA: Diagnosis not present

## 2023-01-10 DIAGNOSIS — Z789 Other specified health status: Secondary | ICD-10-CM

## 2023-01-10 MED ORDER — NORGESTIMATE-ETH ESTRADIOL 0.25-35 MG-MCG PO TABS
1.0000 | ORAL_TABLET | Freq: Every day | ORAL | 1 refills | Status: DC
Start: 2023-01-10 — End: 2023-03-27

## 2023-01-10 MED ORDER — FLUCONAZOLE 200 MG PO TABS
ORAL_TABLET | ORAL | 0 refills | Status: AC
Start: 2023-01-10 — End: 2023-01-24

## 2023-01-10 NOTE — Assessment & Plan Note (Signed)
Trial of 1 month of OCP's, if still bothersome can remove and trial alternative birth control method if she desires.

## 2023-01-10 NOTE — Assessment & Plan Note (Signed)
Suspect candidal infection in setting of thrush diagnosed in infant and pain out of proportion to exam, rx sent for diflucan.
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
## 2023-01-14 ENCOUNTER — Telehealth: Payer: Self-pay | Admitting: Clinical

## 2023-01-14 NOTE — Telephone Encounter (Signed)
Attempt call regarding referral; Unable to connect as "call cannot be completed".

## 2023-01-29 NOTE — BH Specialist Note (Deleted)
Pt did not arrive to video visit and did not answer the phone; Left HIPPA-compliant message to call back Jerrian Mells from [facility] at  [phone] (Rahi Chandonnet's office).  ?; left MyChart message for patient.  ? ?

## 2023-02-11 ENCOUNTER — Other Ambulatory Visit: Payer: Self-pay

## 2023-02-11 ENCOUNTER — Telehealth: Payer: Self-pay | Admitting: Family Medicine

## 2023-02-11 ENCOUNTER — Encounter (HOSPITAL_COMMUNITY): Payer: Self-pay

## 2023-02-11 ENCOUNTER — Emergency Department (HOSPITAL_COMMUNITY)
Admission: EM | Admit: 2023-02-11 | Discharge: 2023-02-11 | Disposition: A | Discharge status: Home / Self Care | Payer: Medicaid Other | Attending: Emergency Medicine | Admitting: Emergency Medicine

## 2023-02-11 DIAGNOSIS — T7840XA Allergy, unspecified, initial encounter: Secondary | ICD-10-CM | POA: Insufficient documentation

## 2023-02-11 MED ORDER — CETIRIZINE HCL 10 MG PO TABS: 10.0000 mg | ORAL_TABLET | Freq: Every day | ORAL | 0 refills | Status: DC

## 2023-02-11 MED ORDER — FAMOTIDINE 20 MG PO TABS
20.0000 mg | ORAL_TABLET | Freq: Once | ORAL | Status: AC
  Administered 2023-02-11: 20 mg via ORAL
  Filled 2023-02-11: qty 1

## 2023-02-11 MED ORDER — DEXAMETHASONE SODIUM PHOSPHATE 10 MG/ML IJ SOLN
10.0000 mg | Freq: Once | INTRAMUSCULAR | Status: AC
  Administered 2023-02-11: 10 mg
  Filled 2023-02-11: qty 1

## 2023-02-11 MED ORDER — EPINEPHRINE 0.3 MG/0.3ML IJ SOAJ: 0.3000 mg | INTRAMUSCULAR | 0 refills | Status: DC | PRN

## 2023-02-11 MED ORDER — PREDNISONE 10 MG (21) PO TBPK: ORAL_TABLET | Freq: Every day | ORAL | 0 refills | Status: DC

## 2023-02-11 NOTE — Telephone Encounter (Signed)
Patient state she is having some breast issues and she want to talk to someone

## 2023-02-11 NOTE — ED Notes (Addendum)
Sleeping, arousable to voice, awake, interactive, resps e/u, speaking in clear complete sentences, VSS. Minimal facial swelling remains, "feel better". Throat/ mouth/ voice dry, otherwise unremarkable. Denies itching at this time. LS CTA. Denies sx or complaints, questions or needs, eRx x3 given, sister coming to pick her up.
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
## 2023-02-11 NOTE — ED Triage Notes (Signed)
Pt arrived from home via POV w c/o allergic reaction that began last week. Pt states that she has rash on face and on chest. Pt states that her eyes are swollen and now she has lost her voice. Pt states that her throat is itchy.

## 2023-02-11 NOTE — Discharge Instructions (Addendum)
Please follow-up with your primary doctor(s) within 2-3 days. Please avoid any known triggers of your allergies.  We have given you a prescription for an Epi-Pen. Please pick it up as soon as possible. Always carry this with you. Only use the Epi-Pen in the event of a severe allergic reaction with trouble breathing or throat swelling. You must go to the hospital right away if you ever use the Epi-Pen. Remember that they expire every year so you should have your doctor write a new prescription yearly. We have also given you a prescription for prednisone and an antihistamine. Please pick it up as soon as possible and use as directed.    Please return to the Emergency Department right away if you have any worsening or new shortness of breath, changes in your voice, tightness/itching in your mouth/throat, swelling, severe hives, chest pain, high fever. There is a very small chance of a recurrence of the allergic reaction, typically in the next 24 hours. If you see the same symptoms (rash, trouble breathing, vomiting, etc) return, come back to the Emergency Department immediately.

## 2023-02-13 MED ORDER — NYSTATIN 100000 UNIT/GM EX CREA: TOPICAL_CREAM | CUTANEOUS | 1 refills | Status: DC

## 2023-02-13 NOTE — Telephone Encounter (Signed)
Returned patient call in regards to breast pain. She did not answer. Per chart review, infant with oral Thrush and mom with breast pain that was treated with 1 dose of Diflucan on 05/17.   LM for mom to check her My Chart message for further care instructions.

## 2023-03-05 ENCOUNTER — Encounter: Payer: Medicaid Other | Admitting: Family Medicine

## 2023-03-23 ENCOUNTER — Encounter (HOSPITAL_COMMUNITY): Payer: Self-pay

## 2023-03-23 ENCOUNTER — Ambulatory Visit (HOSPITAL_COMMUNITY)
Admission: EM | Admit: 2023-03-23 | Discharge: 2023-03-23 | Disposition: A | Discharge status: Home / Self Care | Payer: Medicaid Other | Attending: Family Medicine | Admitting: Family Medicine

## 2023-03-23 DIAGNOSIS — L309 Dermatitis, unspecified: Secondary | ICD-10-CM

## 2023-03-23 LAB — POCT URINE PREGNANCY: Preg Test, Ur: NEGATIVE

## 2023-03-23 MED ORDER — TRIAMCINOLONE ACETONIDE 0.1 % EX CREA: 1.0000 | TOPICAL_CREAM | Freq: Two times a day (BID) | CUTANEOUS | 0 refills | Status: DC

## 2023-03-23 MED ORDER — FLUCONAZOLE 100 MG PO TABS: ORAL_TABLET | ORAL | 0 refills | Status: DC

## 2023-03-23 NOTE — Discharge Instructions (Signed)
Your pregnancy test was negative.  Fluconazole 100 mg--2 tablets by mouth the first day then 1 tablet by mouth daily for 6 more days.  Triamcinolone cream--apply 2 times daily to the rash area until better, about 10 to 14 days.  Please followup with your gyn about both issues.
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
## 2023-03-23 NOTE — ED Triage Notes (Signed)
Pt is here for rash around her nipple area x 3 months. Pt ob provider prescribed Nystatin cream this has not help. Pt is requesting a urine pregnancy test. Pt has the nexplanon inserted six months ago. Pt stated she has been bleeding all this time. Pt is not breast feeding.

## 2023-03-27 ENCOUNTER — Ambulatory Visit (INDEPENDENT_AMBULATORY_CARE_PROVIDER_SITE_OTHER): Payer: Medicaid Other | Admitting: Family Medicine

## 2023-03-27 ENCOUNTER — Encounter: Payer: Self-pay | Admitting: Family Medicine

## 2023-03-27 ENCOUNTER — Other Ambulatory Visit: Payer: Self-pay

## 2023-03-27 VITALS — BP 121/69 | HR 79 | Wt 171.3 lb

## 2023-03-27 DIAGNOSIS — Z975 Presence of (intrauterine) contraceptive device: Secondary | ICD-10-CM

## 2023-03-27 DIAGNOSIS — N921 Excessive and frequent menstruation with irregular cycle: Secondary | ICD-10-CM | POA: Diagnosis not present

## 2023-03-27 MED ORDER — NORGESTIMATE-ETH ESTRADIOL 0.25-35 MG-MCG PO TABS
1.0000 | ORAL_TABLET | Freq: Every day | ORAL | 1 refills | Status: DC
Start: 2023-03-27 — End: 2023-07-11

## 2023-03-27 NOTE — Progress Notes (Signed)
   Subjective:    Patient ID: Amy Nguyen is a 22 y.o. female presenting with Follow-up  on 03/27/2023  HPI: Has Nexplanon in place since she gave birth to her child in 1/24. Has had irregular unpredictable spotting/bleeding since. Tried 1 month of COCs which helped, but bleeding has returned.   Review of Systems  Constitutional:  Negative for chills and fever.  Respiratory:  Negative for shortness of breath.   Cardiovascular:  Negative for chest pain.  Gastrointestinal:  Negative for abdominal pain, nausea and vomiting.  Genitourinary:  Negative for dysuria.  Skin:  Negative for rash.      Objective:    BP 121/69   Pulse 79   Wt 171 lb 4.8 oz (77.7 kg)   LMP 03/23/2023 (Approximate)   Breastfeeding No   BMI 26.05 kg/m  Physical Exam Exam conducted with a chaperone present.  Constitutional:      General: She is not in acute distress.    Appearance: She is well-developed.  HENT:     Head: Normocephalic and atraumatic.  Eyes:     General: No scleral icterus. Cardiovascular:     Rate and Rhythm: Normal rate.  Pulmonary:     Effort: Pulmonary effort is normal.  Abdominal:     Palpations: Abdomen is soft.  Musculoskeletal:     Cervical back: Neck supple.  Skin:    General: Skin is warm and dry.  Neurological:     Mental Status: She is alert and oriented to person, place, and time.         Assessment & Plan:   Problem List Items Addressed This Visit       Unprioritized   Breakthrough bleeding on Nexplanon - Primary    Wants to try COCs once more--and not remove her Nexplanon.      Relevant Medications   norgestimate-ethinyl estradiol (ORTHO-CYCLEN) 0.25-35 MG-MCG tablet    Return in about 3 months (around 06/27/2023) for Mom+Baby Care--needs baby appointment .  Reva Bores, MD 03/27/2023 1:56 PM

## 2023-03-27 NOTE — Assessment & Plan Note (Signed)
Wants to try COCs once more--and not remove her Nexplanon.

## 2023-04-08 ENCOUNTER — Ambulatory Visit: Payer: Medicaid Other | Admitting: Allergy & Immunology

## 2023-05-12 ENCOUNTER — Ambulatory Visit (HOSPITAL_COMMUNITY)
Admission: EM | Admit: 2023-05-12 | Discharge: 2023-05-12 | Disposition: A | Discharge status: Home / Self Care | Payer: Medicaid Other

## 2023-05-12 NOTE — ED Triage Notes (Signed)
Patient c/o left ear pain and a sore throat x 3 days.  Patient denies taking any medication for her symptoms.

## 2023-06-02 ENCOUNTER — Ambulatory Visit (HOSPITAL_COMMUNITY)
Admission: EM | Admit: 2023-06-02 | Discharge: 2023-06-02 | Disposition: A | Discharge status: Home / Self Care | Payer: Medicaid Other | Attending: Emergency Medicine | Admitting: Emergency Medicine

## 2023-06-02 ENCOUNTER — Encounter (HOSPITAL_COMMUNITY): Payer: Self-pay

## 2023-06-02 DIAGNOSIS — Z113 Encounter for screening for infections with a predominantly sexual mode of transmission: Secondary | ICD-10-CM | POA: Insufficient documentation

## 2023-06-02 DIAGNOSIS — N898 Other specified noninflammatory disorders of vagina: Secondary | ICD-10-CM | POA: Insufficient documentation

## 2023-06-02 DIAGNOSIS — B372 Candidiasis of skin and nail: Secondary | ICD-10-CM | POA: Insufficient documentation

## 2023-06-02 LAB — HIV ANTIBODY (ROUTINE TESTING W REFLEX): HIV Screen 4th Generation wRfx: NONREACTIVE

## 2023-06-02 LAB — POCT URINALYSIS DIP (MANUAL ENTRY)
Bilirubin, UA: NEGATIVE
Glucose, UA: NEGATIVE mg/dL
Ketones, POC UA: NEGATIVE mg/dL
Nitrite, UA: NEGATIVE
Protein Ur, POC: NEGATIVE mg/dL
Spec Grav, UA: 1.02 (ref 1.010–1.025)
Urobilinogen, UA: 0.2 U/dL
pH, UA: 6 (ref 5.0–8.0)

## 2023-06-02 LAB — POCT URINE PREGNANCY: Preg Test, Ur: NEGATIVE

## 2023-06-02 MED ORDER — FLUCONAZOLE 150 MG PO TABS: ORAL_TABLET | ORAL | 0 refills | Status: DC

## 2023-06-02 NOTE — Discharge Instructions (Signed)
Start taking Diflucan as prescribed. Your results will return over the next few days and someone will call and adjust treatment as necessary.  Return here as needed.
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
## 2023-06-02 NOTE — ED Triage Notes (Signed)
Patient c/o vaginal discharge and itching x 3 days. Pt request pregnancy test and urine test for infection.

## 2023-06-03 ENCOUNTER — Telehealth: Payer: Self-pay

## 2023-06-03 LAB — CERVICOVAGINAL ANCILLARY ONLY
Bacterial Vaginitis (gardnerella): POSITIVE — AB
Candida Glabrata: NEGATIVE
Candida Vaginitis: POSITIVE — AB
Chlamydia: NEGATIVE
Comment: NEGATIVE
Comment: NEGATIVE
Comment: NEGATIVE
Comment: NEGATIVE
Comment: NEGATIVE
Comment: NORMAL
Neisseria Gonorrhea: NEGATIVE
Trichomonas: NEGATIVE

## 2023-06-03 LAB — RPR: RPR Ser Ql: NONREACTIVE

## 2023-06-03 MED ORDER — METRONIDAZOLE 500 MG PO TABS: 500.0000 mg | ORAL_TABLET | Freq: Two times a day (BID) | ORAL | 0 refills | Status: AC

## 2023-06-03 NOTE — Telephone Encounter (Signed)
Per protocol, pt requires tx with metronidazole. Attempted to reach patient x1. Unable to LVM.  Rx sent to pharmacy on file.

## 2023-06-19 NOTE — Plan of Care (Signed)
[facility] Tonsillectomy/Adenoidectomy, Postoperative PEDS care plan entered in error.

## 2023-06-20 ENCOUNTER — Ambulatory Visit (HOSPITAL_COMMUNITY)
Admission: EM | Admit: 2023-06-20 | Discharge: 2023-06-20 | Disposition: A | Discharge status: Home / Self Care | Payer: Medicaid Other | Attending: Nurse Practitioner | Admitting: Nurse Practitioner

## 2023-06-20 DIAGNOSIS — F419 Anxiety disorder, unspecified: Secondary | ICD-10-CM | POA: Insufficient documentation

## 2023-06-20 DIAGNOSIS — F53 Postpartum depression: Secondary | ICD-10-CM | POA: Insufficient documentation

## 2023-06-20 DIAGNOSIS — F439 Reaction to severe stress, unspecified: Secondary | ICD-10-CM | POA: Insufficient documentation

## 2023-06-20 NOTE — ED Notes (Signed)
Patient has been given back her belongings and she is in the waiting room waiting for [employer] to pick her up and take her home.
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
## 2023-06-20 NOTE — Discharge Instructions (Addendum)
  Discharge recommendations:  Please follow up with your primary care provider for all medical related needs.   Therapy: We recommend that patient participate in individual therapy to address mental health concerns.  Safety:  The patient should abstain from use of illicit substances/drugs and abuse of any medications. If symptoms worsen or do not continue to improve or if the patient becomes actively suicidal or homicidal then it is recommended that the patient return to the closest hospital emergency department, the [facility], or call 911 for further evaluation and treatment. National Suicide Prevention Lifeline 1-800-SUICIDE or 701-112-8624.  About 988 988 offers 24/7 access to trained crisis counselors who can help people experiencing mental health-related distress. People can call or text 988 or chat 988lifeline.org for themselves or if they are worried about a loved one who may need crisis support.  Crisis Mobile: [facility]:                     952-088-1991 (for crisis response 24 hours a day) [facility] Hotline:                                            613-651-1245

## 2023-06-24 ENCOUNTER — Other Ambulatory Visit: Payer: Self-pay | Admitting: Family Medicine

## 2023-06-24 DIAGNOSIS — L309 Dermatitis, unspecified: Secondary | ICD-10-CM

## 2023-06-24 NOTE — Progress Notes (Signed)
Seen at same time as [facility], ongoing problems with L breast infection, draining. Orders placed for L breast US, pending that will determine best referral (Breast surgeon vs derm).

## 2023-06-30 ENCOUNTER — Ambulatory Visit
Admission: RE | Admit: 2023-06-30 | Discharge: 2023-06-30 | Disposition: A | Discharge status: Home / Self Care | Payer: Medicaid Other | Source: Ambulatory Visit | Attending: Family Medicine

## 2023-06-30 DIAGNOSIS — L309 Dermatitis, unspecified: Secondary | ICD-10-CM

## 2023-06-30 IMAGING — US US OB < 14 WEEKS - US OB TV
1 series · 15 of 28 positions shown · non-contrast
Comparison: None Available.

CLINICAL DATA: Abdominal pain.

EXAM:
OBSTETRIC <14 WK US AND TRANSVAGINAL OB US
TECHNIQUE: Both transabdominal and transvaginal ultrasound examinations were
performed for complete evaluation of the gestation as well as the
maternal uterus, adnexal regions, and pelvic cul-de-sac.
Transvaginal technique was performed to assess early pregnancy.

[Series 1: us ob < 14 weeks - us ob tv · 57 acquisitions, 15 frames shown]
[im 1/57]
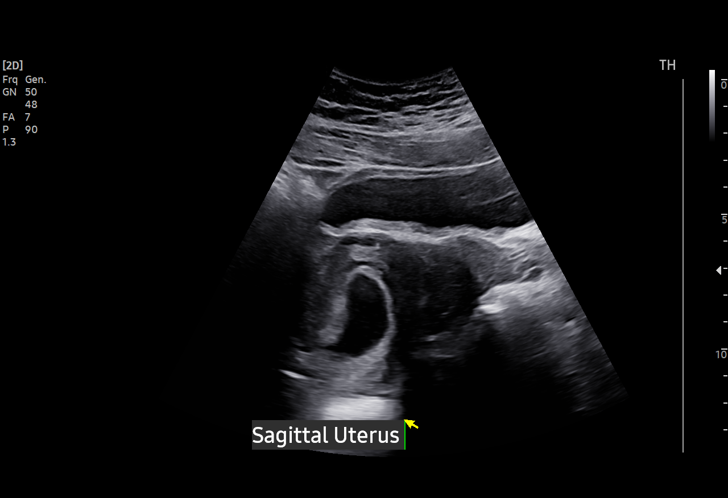
[im 5/57]
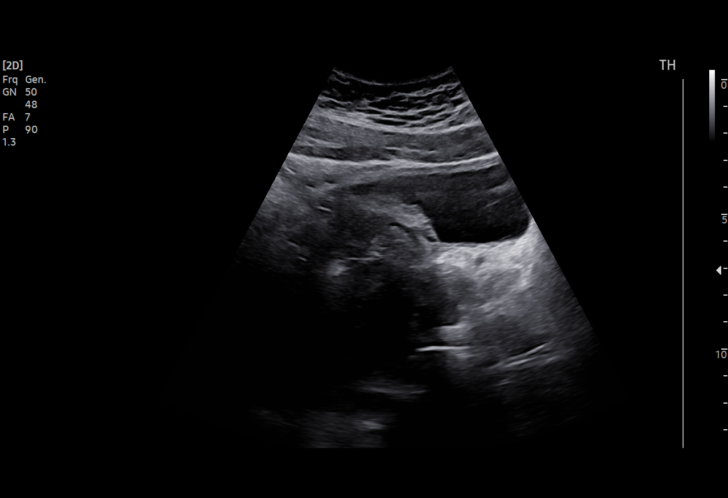
[im 9/57]
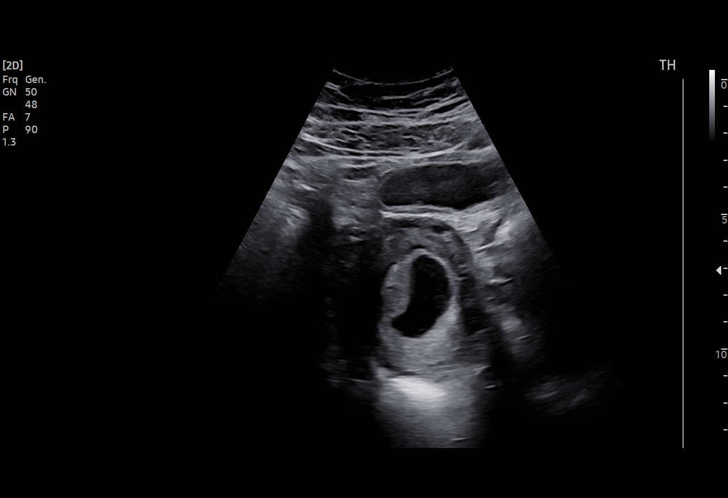
[im 13/57]
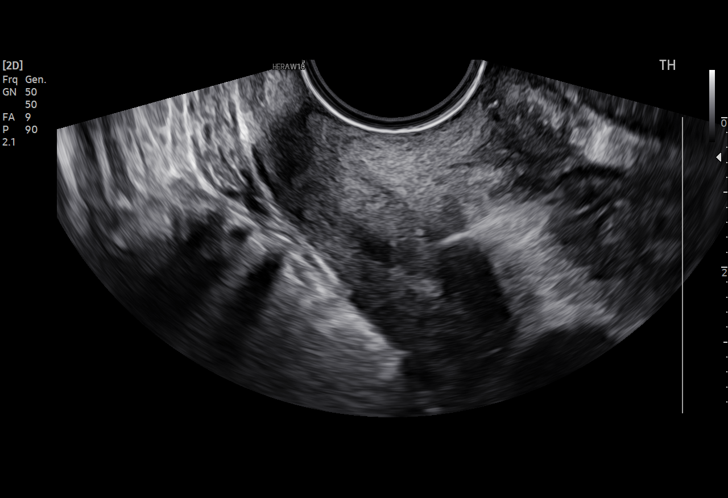
[im 17/57]
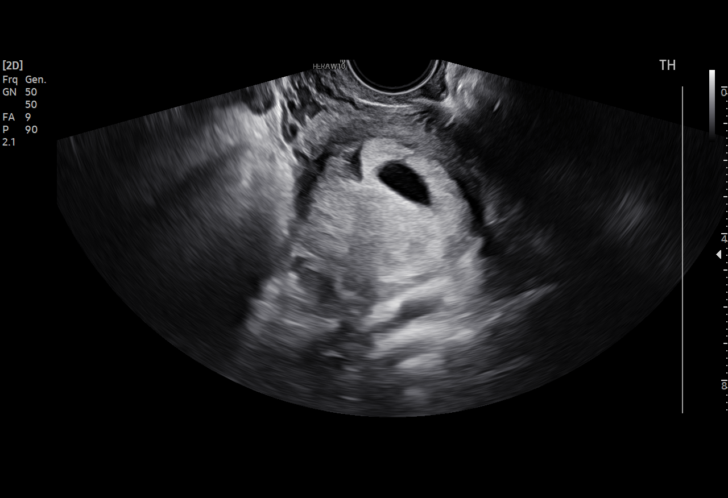
[im 21/57]
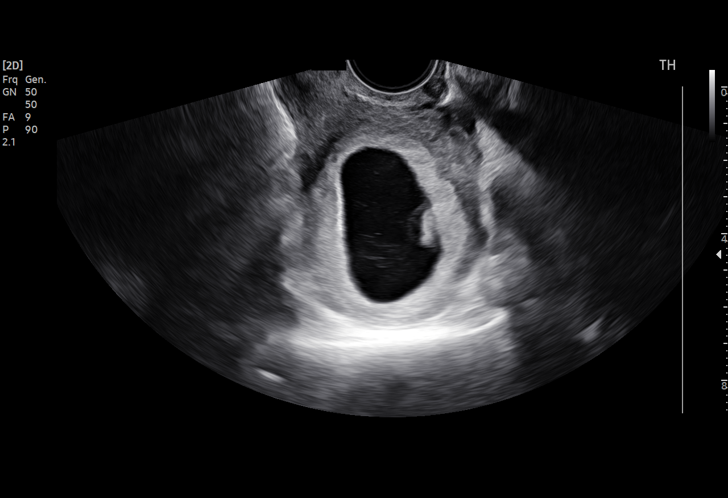
[im 25/57]
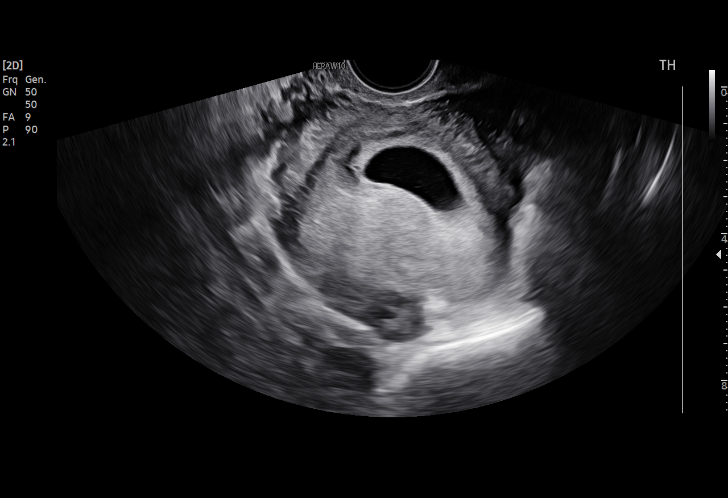
[im 30/57]
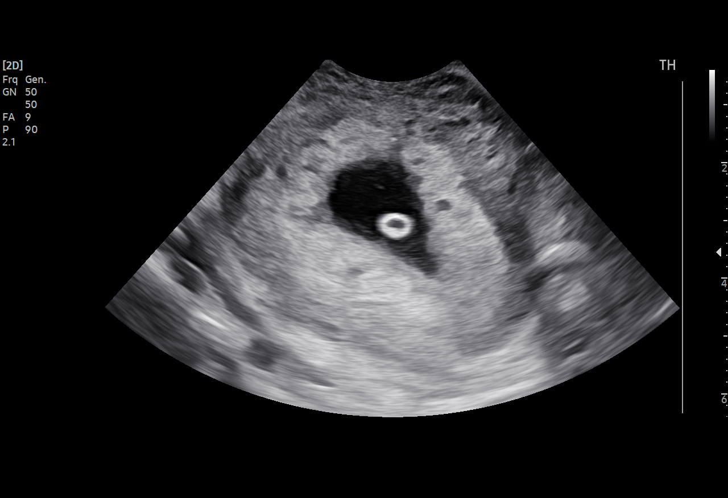
[im 32/57]
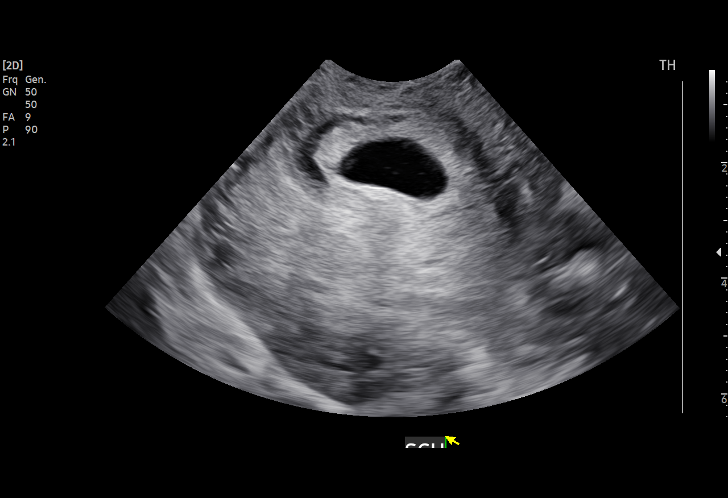
[im 36/57]
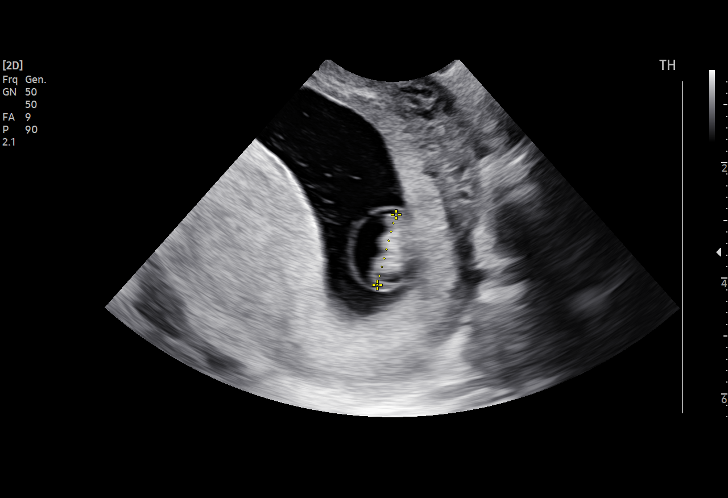
[im 40/57]
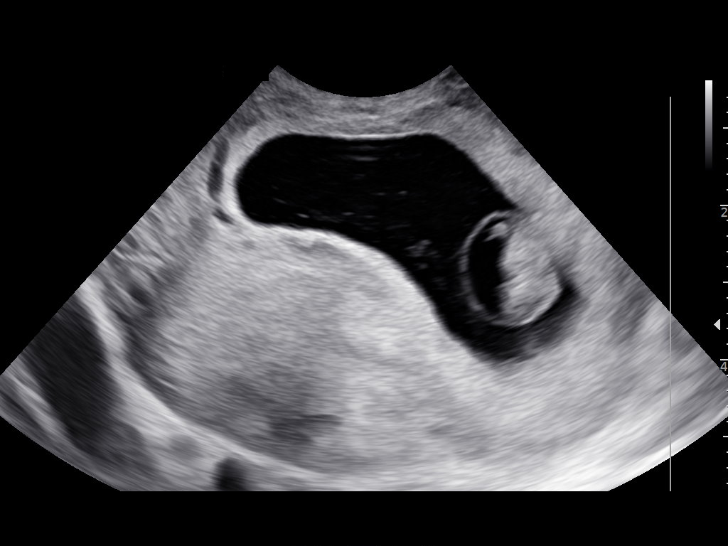
[im 44/57]
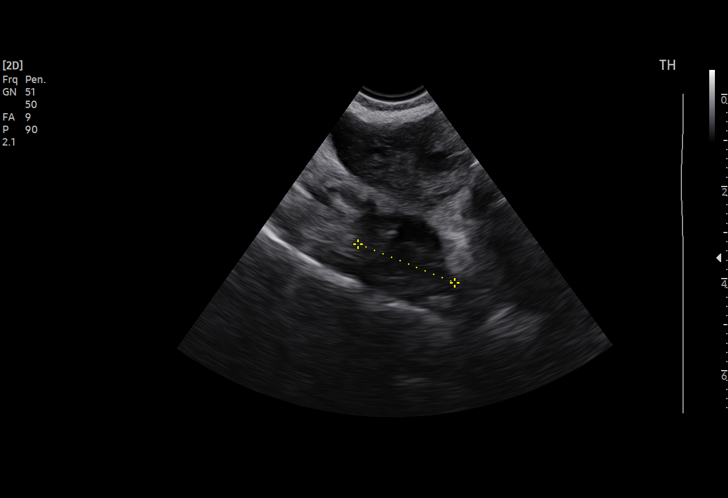
[im 48/57]
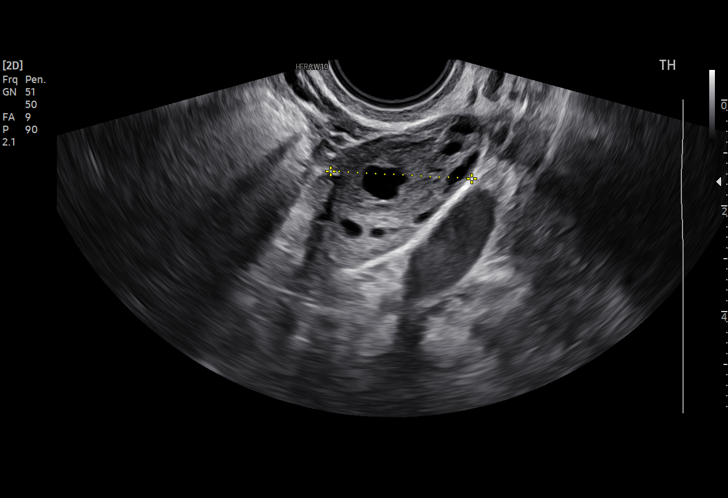
[im 52/57]
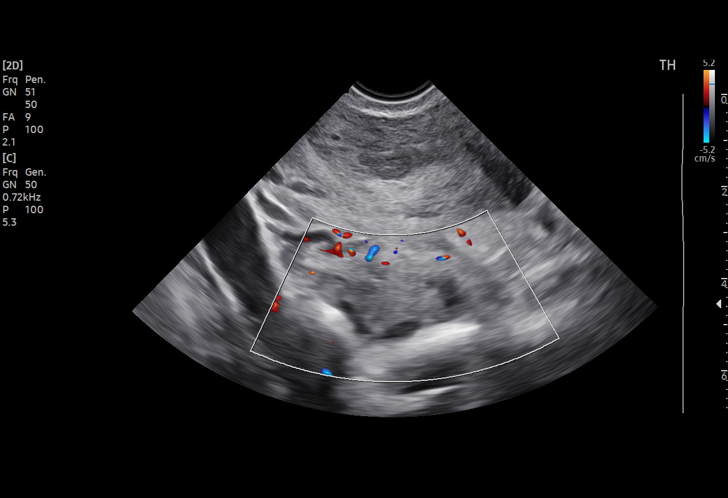
[im 57/57]
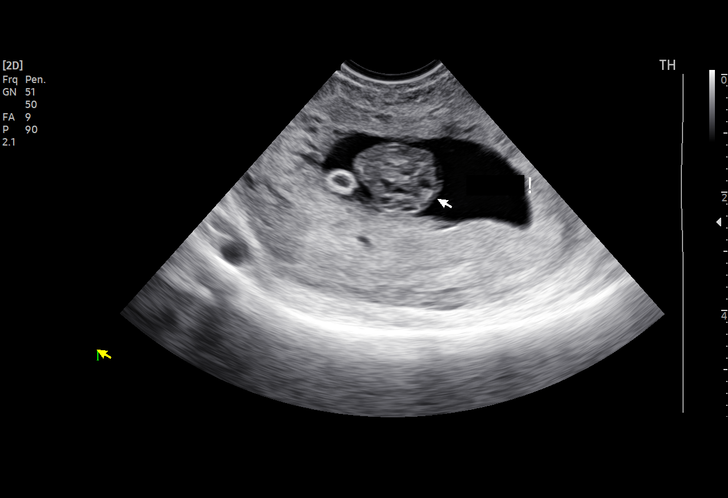

[15 of 28 positions shown; findings below may reference images not displayed]

FINDINGS: Intrauterine gestational sac: Single

Yolk sac:  Visualized.

Embryo:  Visualized.

Cardiac Activity: Visualized.

Heart Rate: 160 bpm

CRL:  15.4 mm   7 w   6 d                  US EDC: 09/20/2022

Subchorionic hemorrhage: Small subchorionic hemorrhage identified.
The chorion and amnion appear separated which is within normal
limits for a gestation this age; however, there is some debris
identified in this space.

Maternal uterus/adnexae: Bilateral ovaries appear within normal
limits. There is a corpus luteum in the left ovary. Trace free fluid
in the pelvis.
IMPRESSION: 1. Single live intrauterine gestation measuring 7 weeks 6 days by
crown-rump length.
2. Small subchorionic hemorrhage.
3. Debris identified within the space between the chorion and
amnion. This finding is indeterminate. Short-term follow-up
ultrasound suggested to re-evaluate.

## 2023-07-11 ENCOUNTER — Encounter (HOSPITAL_COMMUNITY): Payer: Self-pay | Admitting: Emergency Medicine

## 2023-07-11 ENCOUNTER — Ambulatory Visit (HOSPITAL_COMMUNITY)
Admission: EM | Admit: 2023-07-11 | Discharge: 2023-07-11 | Disposition: A | Discharge status: Home / Self Care | Payer: Medicaid Other | Attending: Internal Medicine | Admitting: Internal Medicine

## 2023-07-11 DIAGNOSIS — R112 Nausea with vomiting, unspecified: Secondary | ICD-10-CM | POA: Diagnosis not present

## 2023-07-11 DIAGNOSIS — B349 Viral infection, unspecified: Secondary | ICD-10-CM | POA: Diagnosis present

## 2023-07-11 LAB — CBC WITH DIFFERENTIAL/PLATELET
Abs Immature Granulocytes: 0.01 10*3/uL (ref 0.00–0.07)
Basophils Absolute: 0.1 10*3/uL (ref 0.0–0.1)
Basophils Relative: 1 %
Eosinophils Absolute: 0.2 10*3/uL (ref 0.0–0.5)
Eosinophils Relative: 4 %
HCT: 39.7 % (ref 36.0–46.0)
Hemoglobin: 12.8 g/dL (ref 12.0–15.0)
Immature Granulocytes: 0 %
Lymphocytes Relative: 45 %
Lymphs Abs: 2.6 10*3/uL (ref 0.7–4.0)
MCH: 29.6 pg (ref 26.0–34.0)
MCHC: 32.2 g/dL (ref 30.0–36.0)
MCV: 91.9 fL (ref 80.0–100.0)
Monocytes Absolute: 0.5 10*3/uL (ref 0.1–1.0)
Monocytes Relative: 8 %
Neutro Abs: 2.4 10*3/uL (ref 1.7–7.7)
Neutrophils Relative %: 42 %
Platelets: 319 10*3/uL (ref 150–400)
RBC: 4.32 MIL/uL (ref 3.87–5.11)
RDW: 13.2 % (ref 11.5–15.5)
WBC: 5.8 10*3/uL (ref 4.0–10.5)
nRBC: 0 % (ref 0.0–0.2)

## 2023-07-11 LAB — POCT URINALYSIS DIP (MANUAL ENTRY)
Bilirubin, UA: NEGATIVE
Glucose, UA: NEGATIVE mg/dL
Ketones, POC UA: NEGATIVE mg/dL
Leukocytes, UA: NEGATIVE
Nitrite, UA: NEGATIVE
Protein Ur, POC: NEGATIVE mg/dL
Spec Grav, UA: 1.02 (ref 1.010–1.025)
Urobilinogen, UA: 0.2 U/dL
pH, UA: 7.5 (ref 5.0–8.0)

## 2023-07-11 LAB — COMPREHENSIVE METABOLIC PANEL
ALT: 13 U/L (ref 0–44)
AST: 24 U/L (ref 15–41)
Albumin: 3.8 g/dL (ref 3.5–5.0)
Alkaline Phosphatase: 69 U/L (ref 38–126)
Anion gap: 8 (ref 5–15)
BUN: 8 mg/dL (ref 6–20)
CO2: 23 mmol/L (ref 22–32)
Calcium: 9.1 mg/dL (ref 8.9–10.3)
Chloride: 106 mmol/L (ref 98–111)
Creatinine, Ser: 0.66 mg/dL (ref 0.44–1.00)
GFR, Estimated: >60 mL/min (ref >60)
Glucose, Bld: 96 mg/dL (ref 70–99)
Potassium: 4.7 mmol/L (ref 3.5–5.1)
Sodium: 137 mmol/L (ref 135–145)
Total Bilirubin: 0.6 mg/dL (ref <1.2)
Total Protein: 7.3 g/dL (ref 6.5–8.1)

## 2023-07-11 LAB — HCG, QUANTITATIVE, PREGNANCY: hCG, Beta Chain, Quant, S: 1 m[IU]/mL (ref <5)

## 2023-07-11 LAB — POCT URINE PREGNANCY: Preg Test, Ur: NEGATIVE

## 2023-07-11 MED ORDER — ONDANSETRON 4 MG PO TBDP
4.0000 mg | ORAL_TABLET | Freq: Once | ORAL | Status: AC
  Administered 2023-07-11: 4 mg via ORAL

## 2023-07-11 MED ORDER — ONDANSETRON 4 MG PO TBDP
ORAL_TABLET | ORAL | Status: AC
  Filled 2023-07-11: qty 1

## 2023-07-11 MED ORDER — ONDANSETRON 4 MG PO TBDP: 4.0000 mg | ORAL_TABLET | Freq: Three times a day (TID) | ORAL | 0 refills | Status: DC | PRN
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
## 2023-07-11 NOTE — Discharge Instructions (Signed)
I am glad that you are feeling better after the medication.  I am still waiting on your blood work and we will contact you if any of this is abnormal.  Use Zofran every 8 hours as needed for nausea and vomiting.  Eat a bland diet and drink plenty of fluid.  If your symptoms are not improving within a few days you need to be seen again.  If anything worsens and you have nausea/vomiting despite the medication, severe abdominal pain, fever, blood in your stool, blood in your vomit you must go to the ER immediately.
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
## 2023-07-11 NOTE — ED Notes (Signed)
Patient having emesis every morning and randomly throughout the day for the last week. Wanting to check to see if she is pregnant or has a stomach bug.

## 2023-08-22 ENCOUNTER — Inpatient Hospital Stay (HOSPITAL_COMMUNITY)
Admission: AD | Admit: 2023-08-22 | Discharge: 2023-08-23 | Discharge status: Against Medical Advice | Payer: Medicaid Other | Attending: Obstetrics & Gynecology | Admitting: Obstetrics & Gynecology

## 2023-08-22 DIAGNOSIS — Z3202 Encounter for pregnancy test, result negative: Secondary | ICD-10-CM | POA: Insufficient documentation

## 2023-08-23 DIAGNOSIS — Z3202 Encounter for pregnancy test, result negative: Secondary | ICD-10-CM | POA: Diagnosis present

## 2023-08-23 LAB — POCT PREGNANCY, URINE: Preg Test, Ur: NEGATIVE
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
## 2023-08-23 NOTE — MAU Note (Signed)
Patient not in lobby since first call by night shift staff at 0540. CNM made aware.

## 2023-10-23 ENCOUNTER — Telehealth (HOSPITAL_COMMUNITY): Payer: Self-pay

## 2023-10-23 NOTE — Telephone Encounter (Signed)
 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 She said That was me - I am trying to receive medical records of hers. I informed her she is not a PT of [facility] - Again she asked for medical records - Again I explained that the PT is not a PT of [facility] - The woman then just walked away

## 2023-11-04 ENCOUNTER — Other Ambulatory Visit: Payer: Self-pay | Admitting: Family Medicine

## 2023-11-04 DIAGNOSIS — N921 Excessive and frequent menstruation with irregular cycle: Secondary | ICD-10-CM

## 2023-11-04 MED ORDER — NORGESTIMATE-ETH ESTRADIOL 0.25-35 MG-MCG PO TABS
1.0000 | ORAL_TABLET | Freq: Every day | ORAL | 1 refills | Status: DC
Start: 2023-11-04 — End: 2023-12-22

## 2023-11-04 NOTE — Progress Notes (Signed)
 Seeing daughter, asked for refill of ortho cyclen for breakthrough bleeding, rx sent

## 2023-11-10 ENCOUNTER — Ambulatory Visit (HOSPITAL_COMMUNITY)
Admission: EM | Admit: 2023-11-10 | Discharge: 2023-11-10 | Disposition: A | Discharge status: Home / Self Care | Attending: Family Medicine | Admitting: Family Medicine

## 2023-11-10 ENCOUNTER — Ambulatory Visit (INDEPENDENT_AMBULATORY_CARE_PROVIDER_SITE_OTHER)

## 2023-11-10 ENCOUNTER — Encounter (HOSPITAL_COMMUNITY): Payer: Self-pay

## 2023-11-10 DIAGNOSIS — M79645 Pain in left finger(s): Secondary | ICD-10-CM

## 2023-11-10 DIAGNOSIS — Z113 Encounter for screening for infections with a predominantly sexual mode of transmission: Secondary | ICD-10-CM | POA: Insufficient documentation

## 2023-11-10 DIAGNOSIS — M25572 Pain in left ankle and joints of left foot: Secondary | ICD-10-CM | POA: Diagnosis present

## 2023-11-10 LAB — POCT URINE PREGNANCY: Preg Test, Ur: NEGATIVE

## 2023-11-10 MED ORDER — KETOROLAC TROMETHAMINE 30 MG/ML IJ SOLN
INTRAMUSCULAR | Status: AC
  Filled 2023-11-10: qty 1

## 2023-11-10 MED ORDER — KETOROLAC TROMETHAMINE 10 MG PO TABS: 10.0000 mg | ORAL_TABLET | Freq: Four times a day (QID) | ORAL | 0 refills | Status: DC | PRN

## 2023-11-10 MED ORDER — KETOROLAC TROMETHAMINE 30 MG/ML IJ SOLN
30.0000 mg | Freq: Once | INTRAMUSCULAR | Status: AC
  Administered 2023-11-10: 30 mg via INTRAMUSCULAR

## 2023-11-10 NOTE — ED Notes (Signed)
 Patient on phone talking to someone. Unable to triage. Will check back soon.
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
## 2023-11-10 NOTE — Discharge Instructions (Signed)
 The pregnancy test is negative.  The x-rays by my review do not show any broken bones. The radiologist will also read your x-ray, and if their interpretation differs significantly from mine, and the management of your condition would change, we will call you.  You have been given a shot of Toradol 30 mg today.  Ketorolac 10 mg tablets--take 1 tablet every 6 hours as needed for pain.  This is the same medicine that is in the shot we just gave you

## 2023-11-11 LAB — CERVICOVAGINAL ANCILLARY ONLY
Bacterial Vaginitis (gardnerella): NEGATIVE
Candida Glabrata: NEGATIVE
Candida Vaginitis: NEGATIVE
Chlamydia: NEGATIVE
Comment: NEGATIVE
Comment: NEGATIVE
Comment: NEGATIVE
Comment: NEGATIVE
Comment: NEGATIVE
Comment: NORMAL
Neisseria Gonorrhea: NEGATIVE
Trichomonas: NEGATIVE

## 2023-12-22 ENCOUNTER — Encounter (HOSPITAL_COMMUNITY): Payer: Self-pay

## 2023-12-22 ENCOUNTER — Ambulatory Visit (HOSPITAL_COMMUNITY)
Admission: EM | Admit: 2023-12-22 | Discharge: 2023-12-22 | Disposition: A | Discharge status: Home / Self Care | Attending: Emergency Medicine | Admitting: Emergency Medicine

## 2023-12-22 DIAGNOSIS — Z113 Encounter for screening for infections with a predominantly sexual mode of transmission: Secondary | ICD-10-CM | POA: Diagnosis not present

## 2023-12-22 DIAGNOSIS — Z3202 Encounter for pregnancy test, result negative: Secondary | ICD-10-CM | POA: Insufficient documentation

## 2023-12-22 DIAGNOSIS — H6993 Unspecified Eustachian tube disorder, bilateral: Secondary | ICD-10-CM | POA: Diagnosis not present

## 2023-12-22 LAB — POCT URINE PREGNANCY: Preg Test, Ur: NEGATIVE

## 2023-12-22 LAB — HIV ANTIBODY (ROUTINE TESTING W REFLEX): HIV Screen 4th Generation wRfx: NONREACTIVE

## 2023-12-22 MED ORDER — CETIRIZINE HCL 10 MG PO TABS: 10.0000 mg | ORAL_TABLET | Freq: Every day | ORAL | 2 refills | Status: DC

## 2023-12-22 MED ORDER — FLUTICASONE PROPIONATE 50 MCG/ACT NA SUSP: 2.0000 | Freq: Every day | NASAL | 2 refills | Status: DC

## 2023-12-22 NOTE — ED Triage Notes (Signed)
 Patient is requesting a pregnancy test, STD testing with blood work and reports tht she has intermittent lower abdominal cramping x 1 month.  Patient also c/o both ears feeling like she is under water.

## 2023-12-22 NOTE — Discharge Instructions (Signed)
 We will call you if anything on your swab or blood work returns positive. You can also see these results on MyChart. Please abstain from sexual intercourse until your results return.  I recommend flonase and zyrtec  daily for ear pressure/fullness
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
## 2023-12-23 LAB — CERVICOVAGINAL ANCILLARY ONLY
Bacterial Vaginitis (gardnerella): NEGATIVE
Candida Glabrata: NEGATIVE
Candida Vaginitis: NEGATIVE
Chlamydia: NEGATIVE
Comment: NEGATIVE
Comment: NEGATIVE
Comment: NEGATIVE
Comment: NEGATIVE
Comment: NEGATIVE
Comment: NORMAL
Neisseria Gonorrhea: NEGATIVE
Trichomonas: NEGATIVE

## 2023-12-23 LAB — RPR: RPR Ser Ql: NONREACTIVE

## 2023-12-29 ENCOUNTER — Other Ambulatory Visit: Payer: Self-pay

## 2023-12-29 ENCOUNTER — Other Ambulatory Visit (HOSPITAL_COMMUNITY)
Admission: RE | Admit: 2023-12-29 | Discharge: 2023-12-29 | Disposition: A | Discharge status: Home / Self Care | Source: Ambulatory Visit | Attending: Family Medicine | Admitting: Family Medicine

## 2023-12-29 ENCOUNTER — Ambulatory Visit (INDEPENDENT_AMBULATORY_CARE_PROVIDER_SITE_OTHER): Admitting: Family Medicine

## 2023-12-29 VITALS — BP 104/66 | HR 166 | Wt 171.0 lb

## 2023-12-29 DIAGNOSIS — Z124 Encounter for screening for malignant neoplasm of cervix: Secondary | ICD-10-CM

## 2023-12-30 NOTE — Progress Notes (Signed)
 ANNUAL EXAM Patient name: Amy Nguyen MRN [id number]  Date of birth: 2001/06/09 Chief Complaint:   Gynecologic Exam  History of Present Illness:   Amy Nguyen is a 23 y.o. G64P1001 African-American female being seen today for a routine annual exam.  Current complaints: Birth control concerns.  Reports that she had a long history of bleeding with the Nexplanon  and was considering removing it but has not had bleeding over the last month.  No LMP recorded. Patient has had an implant.   The pregnancy intention screening data noted above was reviewed. Potential methods of contraception were discussed. The patient elected to proceed with No data recorded.   Last pap never done.  Last mammogram: Not indicated  Last colonoscopy: Not indicated     03/27/2023    2:58 PM 01/10/2023   10:59 AM 09/05/2022    9:48 AM 05/22/2022   12:25 PM 04/22/2022   12:01 PM  Depression screen PHQ 2/9  Decreased Interest 0 1 0 0 0  Down, Depressed, Hopeless 0 1 0 0 1  PHQ - 2 Score 0 2 0 0 1  Altered sleeping 0 1 0 0 0  Tired, decreased energy 0 0 0 1 0  Change in appetite 0 0 0 0 0  Feeling bad or failure about yourself  0 1 0 0 0  Trouble concentrating 0 0 0 0 0  Moving slowly or fidgety/restless 0 0 0 0 0  Suicidal thoughts 0 0 0 0 0  PHQ-9 Score 0 4 0 1 1  Difficult doing work/chores   Not difficult at all Not difficult at all Not difficult at all        03/27/2023    2:58 PM 01/10/2023   10:59 AM 09/05/2022    9:48 AM 05/22/2022   12:26 PM  GAD 7 : Generalized Anxiety Score  Nervous, Anxious, on Edge 0 1 0 0  Control/stop worrying 0 0 0 0  Worry too much - different things 0 1 0 0  Trouble relaxing 0 1 0 0  Restless 0 0 0 0  Easily annoyed or irritable 0 1 0 0  Afraid - awful might happen 0 1 0 0  Total GAD 7 Score 0 5 0 0  Anxiety Difficulty   Not difficult at all Not difficult at all     Review of Systems:   Pertinent items are noted in HPI Denies any headaches, blurred vision,  fatigue, shortness of breath, chest pain, abdominal pain, abnormal vaginal discharge/itching/odor/irritation, problems with periods, bowel movements, urination, or intercourse unless otherwise stated above. Pertinent History Reviewed:  Reviewed past medical,surgical, social and family history.  Reviewed problem list, medications and allergies. Physical Assessment:   Vitals:   12/29/23 1409  BP: 104/66  Pulse: (!) 166  Weight: 171 lb (77.6 kg)  Body mass index is 26 kg/m.        Physical Examination:   General appearance - well appearing, and in no distress  Mental status - alert, oriented to person, place, and time  Psych:  She has a normal mood and affect  Skin - warm and dry, normal color, no suspicious lesions noted  Chest - effort normal  Heart - normal rate   Neck:  midline trachea, no thyromegaly or nodules  Abdomen - soft, nontender, nondistended, no masses or organomegaly  Pelvic - VULVA: normal appearing vulva with no masses, tenderness or lesions  VAGINA: normal appearing vagina with normal color and discharge, no lesions  CERVIX: normal appearing cervix without discharge or lesions, no CMT  Thin prep pap is done with reflexive HR HPV cotesting  UTERUS: uterus is felt to be normal size, shape, consistency and nontender   ADNEXA: No adnexal masses or tenderness noted.  Extremities:  No swelling or varicosities noted  Chaperone present for exam  No results found for this or any previous visit (from the past 24 hours).  Assessment & Plan:  1) Well-Woman Exam  2) contraceptive management-patient reports that with her Nexplanon  she had been having bleeding all month every month since it was placed but over the last month her bleeding is stopped and she is feeling better.  She is no longer taking the oral birth control pills.  She has no interest in pregnancy at this time and wants to continue to keep some form of birth control.  Discussed switching to an IUD and patient likes  the idea of this but at this point wants to keep her Nexplanon  and if the bleeding returns she will call to have appointment scheduled for removal of Nexplanon  and placement of IUD.  Labs/procedures today: Pap smear  Mammogram: @ 21yo, or sooner if problems Colonoscopy: @ 21yo, or sooner if problems  No orders of the defined types were placed in this encounter.   Meds: No orders of the defined types were placed in this encounter.   Follow-up: No follow-ups on file.  Ketty Bitton V Eliud Polo, MD 12/30/2023 2:57 PM

## 2024-01-05 LAB — CYTOLOGY - PAP
Chlamydia: NEGATIVE
Comment: NEGATIVE
Comment: NEGATIVE
Comment: NEGATIVE
Comment: NORMAL
Diagnosis: UNDETERMINED — AB
High risk HPV: NEGATIVE
Neisseria Gonorrhea: NEGATIVE
Trichomonas: NEGATIVE

## 2024-01-06 ENCOUNTER — Ambulatory Visit: Payer: Self-pay | Admitting: Family Medicine

## 2024-03-02 ENCOUNTER — Encounter (HOSPITAL_COMMUNITY): Payer: Self-pay

## 2024-03-02 ENCOUNTER — Ambulatory Visit (HOSPITAL_COMMUNITY)
Admission: EM | Admit: 2024-03-02 | Discharge: 2024-03-02 | Disposition: A | Discharge status: Home / Self Care | Attending: Emergency Medicine | Admitting: Emergency Medicine

## 2024-03-02 DIAGNOSIS — Z113 Encounter for screening for infections with a predominantly sexual mode of transmission: Secondary | ICD-10-CM | POA: Diagnosis not present

## 2024-03-02 DIAGNOSIS — Z3202 Encounter for pregnancy test, result negative: Secondary | ICD-10-CM | POA: Insufficient documentation

## 2024-03-02 LAB — POCT URINE PREGNANCY: Preg Test, Ur: NEGATIVE

## 2024-03-02 NOTE — Discharge Instructions (Addendum)
 We will call you if anything on your swab returns positive. You can also see these results on MyChart. Please abstain from sexual intercourse until your results return.

## 2024-03-02 NOTE — ED Triage Notes (Signed)
 Patient is requesting STD testing and a pregnancy test. Patient denies any symptoms and states she just likes to check.
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
## 2024-03-03 LAB — CERVICOVAGINAL ANCILLARY ONLY
Chlamydia: NEGATIVE
Comment: NEGATIVE
Comment: NEGATIVE
Comment: NORMAL
Neisseria Gonorrhea: NEGATIVE
Trichomonas: NEGATIVE

## 2024-05-29 ENCOUNTER — Ambulatory Visit (HOSPITAL_COMMUNITY)
Admission: EM | Admit: 2024-05-29 | Discharge: 2024-05-29 | Disposition: A | Discharge status: Home / Self Care | Attending: Family Medicine | Admitting: Family Medicine

## 2024-05-29 ENCOUNTER — Encounter (HOSPITAL_COMMUNITY): Payer: Self-pay | Admitting: Emergency Medicine

## 2024-05-29 ENCOUNTER — Other Ambulatory Visit: Payer: Self-pay

## 2024-05-29 DIAGNOSIS — Z0189 Encounter for other specified special examinations: Secondary | ICD-10-CM | POA: Diagnosis present

## 2024-05-29 DIAGNOSIS — N898 Other specified noninflammatory disorders of vagina: Secondary | ICD-10-CM | POA: Diagnosis present

## 2024-05-29 DIAGNOSIS — Z113 Encounter for screening for infections with a predominantly sexual mode of transmission: Secondary | ICD-10-CM | POA: Diagnosis present

## 2024-05-29 LAB — POCT URINALYSIS DIP (MANUAL ENTRY)
Bilirubin, UA: NEGATIVE
Blood, UA: NEGATIVE
Glucose, UA: NEGATIVE mg/dL
Ketones, POC UA: NEGATIVE mg/dL
Leukocytes, UA: NEGATIVE
Nitrite, UA: NEGATIVE
Protein Ur, POC: NEGATIVE mg/dL
Spec Grav, UA: 1.025 (ref 1.010–1.025)
Urobilinogen, UA: 0.2 U/dL
pH, UA: 7.5 (ref 5.0–8.0)

## 2024-05-29 LAB — POCT URINE PREGNANCY: Preg Test, Ur: NEGATIVE

## 2024-05-29 NOTE — ED Triage Notes (Signed)
 Patient requesting std/sti testing.  Patient has odor and a creamy, white discharge.  Patient has had lower back pain.  Denies burning with urinating.  LMP unknown-nexplanon 

## 2024-05-29 NOTE — Discharge Instructions (Signed)
 We have sent testing for sexually transmitted infections. We will notify you of any positive results once they are received. If required, we will prescribe any medications you might need.  Please refrain from all sexual activity for at least the next seven days.

## 2024-05-31 ENCOUNTER — Ambulatory Visit (HOSPITAL_COMMUNITY): Payer: Self-pay

## 2024-05-31 LAB — CERVICOVAGINAL ANCILLARY ONLY
Bacterial Vaginitis (gardnerella): POSITIVE — AB
Candida Glabrata: NEGATIVE
Candida Vaginitis: NEGATIVE
Chlamydia: NEGATIVE
Comment: NEGATIVE
Comment: NEGATIVE
Comment: NEGATIVE
Comment: NEGATIVE
Comment: NEGATIVE
Comment: NORMAL
Neisseria Gonorrhea: NEGATIVE
Trichomonas: NEGATIVE

## 2024-05-31 MED ORDER — METRONIDAZOLE 500 MG PO TABS: 500.0000 mg | ORAL_TABLET | Freq: Two times a day (BID) | ORAL | 0 refills | Status: AC

## 2024-06-02 NOTE — ED Provider Notes (Signed)
 [facility]   [id number] 05/29/24 Arrival Time: 1343  ASSESSMENT & PLAN:  1. Screening for STDs (sexually transmitted diseases)   2. Vaginal discharge   3. Patient request for diagnostic testing       Discharge Instructions      We have sent testing for sexually transmitted infections. We will notify you of any positive results once they are received. If required, we will prescribe any medications you might need.  Please refrain from all sexual activity for at least the next seven days.     Without s/s of PID.  Labs Reviewed  CERVICOVAGINAL ANCILLARY ONLY - Abnormal; Notable for the following components:      Result Value   Bacterial Vaginitis (gardnerella) Positive (*)    All other components within normal limits  POCT URINE PREGNANCY  POCT URINALYSIS DIP (MANUAL ENTRY)     Will notify of any positive results. Instructed to refrain from sexual activity for at least seven days.  Reviewed expectations re: course of current medical issues. Questions answered. Outlined signs and symptoms indicating need for more acute intervention. Patient verbalized understanding. After Visit Summary given.   SUBJECTIVE:  Amy Nguyen is a 23 y.o. female who presents with Patient requesting std/sti testing.  Patient has odor and a creamy, white discharge.  Patient has had lower back pain.  Denies burning with urinating.  LMP unknown-nexplanon    No LMP recorded (lmp unknown). Patient has had an implant.   OBJECTIVE:  Vitals:   05/29/24 1505  BP: 133/83  Pulse: 79  Resp: 18  Temp: 98.4 F (36.9 C)  TempSrc: Oral  SpO2: 98%     General appearance: alert, cooperative, appears stated age and no distress Lungs: unlabored respirations; speaks full sentences without difficulty Back: no CVA tenderness; FROM at waist Abdomen: soft, non-tender GU: deferred Skin: warm and dry Psychological: alert and cooperative; normal mood and affect.    Labs Reviewed   CERVICOVAGINAL ANCILLARY ONLY - Abnormal; Notable for the following components:      Result Value   Bacterial Vaginitis (gardnerella) Positive (*)    All other components within normal limits  POCT URINE PREGNANCY  POCT URINALYSIS DIP (MANUAL ENTRY)    Allergies  Allergen Reactions   No Healthtouch Food Allergies    Pork-Derived Products     No pork    Past Medical History:  Diagnosis Date   Medical history non-contributory    Family History  Problem Relation Age of Onset   Diabetes Mother    Hypertension Father    Social History   Socioeconomic History   Marital status: Single    Spouse name: Not on file   Number of children: Not on file   Years of education: Not on file   Highest education level: Not on file  Occupational History   Not on file  Tobacco Use   Smoking status: Never   Smokeless tobacco: Never  Vaping Use   Vaping status: Every Day   Start date: 12/27/2022   Substances: Nicotine, Flavoring  Substance and Sexual Activity   Alcohol use: Not Currently   Drug use: Never   Sexual activity: Not Currently    Birth control/protection: Implant, Condom  Other Topics Concern   Not on file  Social History Narrative   Not on file   Social Drivers of Health   Financial Resource Strain: Not on file  Food Insecurity: No Food Insecurity (03/27/2023)   Hunger Vital Sign    Worried About Running  Out of Food in the Last Year: Never true    Ran Out of Food in the Last Year: Never true  Transportation Needs: No Transportation Needs (03/27/2023)   PRAPARE - Administrator, Civil Service (Medical): No    Lack of Transportation (Non-Medical): No  Physical Activity: Not on file  Stress: Not on file  Social Connections: Not on file  Intimate Partner Violence: Not on file           Rolinda Rogue, MD 06/02/24 [phone]

## 2024-06-13 ENCOUNTER — Emergency Department (HOSPITAL_COMMUNITY)
Admission: EM | Admit: 2024-06-13 | Discharge: 2024-06-13 | Disposition: A | Discharge status: Home / Self Care | Attending: Emergency Medicine | Admitting: Emergency Medicine

## 2024-06-13 ENCOUNTER — Encounter (HOSPITAL_COMMUNITY): Payer: Self-pay

## 2024-06-13 ENCOUNTER — Emergency Department (HOSPITAL_COMMUNITY)

## 2024-06-13 ENCOUNTER — Other Ambulatory Visit: Payer: Self-pay

## 2024-06-13 DIAGNOSIS — R0789 Other chest pain: Secondary | ICD-10-CM

## 2024-06-13 DIAGNOSIS — K8020 Calculus of gallbladder without cholecystitis without obstruction: Secondary | ICD-10-CM | POA: Diagnosis not present

## 2024-06-13 LAB — CBC
HCT: 39.7 % (ref 36.0–46.0)
Hemoglobin: 12.7 g/dL (ref 12.0–15.0)
MCH: 31 pg (ref 26.0–34.0)
MCHC: 32 g/dL (ref 30.0–36.0)
MCV: 96.8 fL (ref 80.0–100.0)
Platelets: 226 K/uL (ref 150–400)
RBC: 4.1 MIL/uL (ref 3.87–5.11)
RDW: 11.9 % (ref 11.5–15.5)
WBC: 4 K/uL (ref 4.0–10.5)
nRBC: 0 % (ref 0.0–0.2)

## 2024-06-13 LAB — BASIC METABOLIC PANEL WITH GFR
Anion gap: 12 (ref 5–15)
BUN: <5 mg/dL — ABNORMAL LOW (ref 6–20)
CO2: 22 mmol/L (ref 22–32)
Calcium: 8.9 mg/dL (ref 8.9–10.3)
Chloride: 106 mmol/L (ref 98–111)
Creatinine, Ser: 0.53 mg/dL (ref 0.44–1.00)
GFR, Estimated: >60 mL/min (ref >60)
Glucose, Bld: 99 mg/dL (ref 70–99)
Potassium: 3.8 mmol/L (ref 3.5–5.1)
Sodium: 140 mmol/L (ref 135–145)

## 2024-06-13 LAB — LIPASE, BLOOD: Lipase: 23 U/L (ref 11–51)

## 2024-06-13 LAB — HEPATIC FUNCTION PANEL
ALT: 12 U/L (ref 0–44)
AST: 16 U/L (ref 15–41)
Albumin: 3.7 g/dL (ref 3.5–5.0)
Alkaline Phosphatase: 54 U/L (ref 38–126)
Bilirubin, Direct: <0.1 mg/dL (ref 0.0–0.2)
Total Bilirubin: 0.5 mg/dL (ref 0.0–1.2)
Total Protein: 6.9 g/dL (ref 6.5–8.1)

## 2024-06-13 LAB — TROPONIN I (HIGH SENSITIVITY)
Troponin I (High Sensitivity): <2 ng/L (ref <18)
Troponin I (High Sensitivity): <2 ng/L (ref <18)

## 2024-06-13 LAB — HCG, SERUM, QUALITATIVE: Preg, Serum: NEGATIVE

## 2024-06-13 MED ORDER — ACETAMINOPHEN 325 MG PO TABS
650.0000 mg | ORAL_TABLET | Freq: Once | ORAL | Status: AC
  Administered 2024-06-13: 650 mg via ORAL
  Filled 2024-06-13: qty 2

## 2024-06-13 MED ORDER — ONDANSETRON 4 MG PO TBDP: 4.0000 mg | ORAL_TABLET | Freq: Three times a day (TID) | ORAL | 0 refills | Status: DC | PRN

## 2024-06-13 MED ORDER — IOHEXOL 350 MG/ML SOLN
75.0000 mL | Freq: Once | INTRAVENOUS | Status: AC | PRN
  Administered 2024-06-13: 75 mL via INTRAVENOUS

## 2024-06-13 MED ORDER — ONDANSETRON 4 MG PO TBDP
4.0000 mg | ORAL_TABLET | Freq: Once | ORAL | Status: AC
  Administered 2024-06-13: 4 mg via ORAL
  Filled 2024-06-13: qty 1

## 2024-06-13 MED ORDER — NAPROXEN 500 MG PO TABS: 500.0000 mg | ORAL_TABLET | Freq: Two times a day (BID) | ORAL | 0 refills | Status: AC

## 2024-06-13 MED ORDER — METHOCARBAMOL 500 MG PO TABS: 500.0000 mg | ORAL_TABLET | Freq: Two times a day (BID) | ORAL | 0 refills | Status: DC

## 2024-06-13 NOTE — ED Notes (Signed)
 Patient dc by RN. Patient verbalizes understanding of instructions without additional questions. Ambulatory to lobby.
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
## 2024-06-13 NOTE — Discharge Instructions (Addendum)
 It was a pleasure taking care of you here today  As we discussed in the room your CT scans are reassuring.  You do have some stones in your gallbladder however your history is not consistent with an infection in your gallbladder.  I written you for some muscle relaxers, and anti-inflammatories.  As well as some nausea medicine.  Take as prescribed.  Rest over the next few days.

## 2024-06-13 NOTE — ED Triage Notes (Signed)
 Pt BIB [facility] for right sided CP that started around 4pm yesterday. States she was picking up her daughter out of the car when it started. States it hurts when she takes a deep breath, moves or vapes.

## 2024-06-13 NOTE — ED Provider Triage Note (Signed)
 Emergency Medicine Provider Triage Evaluation Note  Amy Nguyen , a 23 y.o. female  was evaluated in triage.  Pt complains of right sided chest pain that wraps around the right side of her chest associate with nausea vomiting and diarrhea.  Vomited once here in ER.  Review of Systems  Positive: N, CP Negative: HA  Physical Exam  BP 110/82 (BP Location: Right Arm)   Pulse (!) 102   Temp 98.7 F (37.1 C) (Oral)   Resp 16   Ht 5' 8 (1.727 m)   Wt 77 kg   LMP  (LMP Unknown)   SpO2 100%   BMI 25.81 kg/m  Gen:   Awake, no distress   Resp:  Normal effort  MSK:   Moves extremities without difficulty  Other:  Abdomen is soft and nontender  Medical Decision Making  Medically screening exam initiated at 10:17 AM.  Appropriate orders placed.  Amy Nguyen was informed that the remainder of the evaluation will be completed by another provider, this initial triage assessment does not replace that evaluation, and the importance of remaining in the ED until their evaluation is complete.  Initiated workup.   Shermon Warren SAILOR, PA-C 06/13/24 1018

## 2024-06-13 NOTE — ED Notes (Signed)
 Pt reports episode of loose stool & vomiting. Provider at bedside assessing pt at this time.

## 2024-09-13 ENCOUNTER — Other Ambulatory Visit: Payer: Self-pay

## 2024-09-13 ENCOUNTER — Encounter (HOSPITAL_COMMUNITY): Payer: Self-pay | Admitting: Emergency Medicine

## 2024-09-13 ENCOUNTER — Ambulatory Visit (HOSPITAL_COMMUNITY)
Admission: EM | Admit: 2024-09-13 | Discharge: 2024-09-13 | Disposition: A | Discharge status: Home / Self Care | Attending: Family Medicine | Admitting: Family Medicine

## 2024-09-13 DIAGNOSIS — Z202 Contact with and (suspected) exposure to infections with a predominantly sexual mode of transmission: Secondary | ICD-10-CM | POA: Insufficient documentation

## 2024-09-13 DIAGNOSIS — L309 Dermatitis, unspecified: Secondary | ICD-10-CM | POA: Diagnosis not present

## 2024-09-13 DIAGNOSIS — N926 Irregular menstruation, unspecified: Secondary | ICD-10-CM | POA: Insufficient documentation

## 2024-09-13 DIAGNOSIS — N760 Acute vaginitis: Secondary | ICD-10-CM | POA: Insufficient documentation

## 2024-09-13 LAB — POCT URINALYSIS DIP (MANUAL ENTRY)
Bilirubin, UA: NEGATIVE
Blood, UA: NEGATIVE
Glucose, UA: NEGATIVE mg/dL
Ketones, POC UA: NEGATIVE mg/dL
Leukocytes, UA: NEGATIVE
Nitrite, UA: NEGATIVE
Protein Ur, POC: NEGATIVE mg/dL
Spec Grav, UA: 1.015
Urobilinogen, UA: 0.2 U/dL
pH, UA: 7

## 2024-09-13 LAB — POCT URINE PREGNANCY: Preg Test, Ur: NEGATIVE

## 2024-09-13 MED ORDER — FLUCONAZOLE 150 MG PO TABS: 150.0000 mg | ORAL_TABLET | ORAL | 0 refills | Status: AC

## 2024-09-13 MED ORDER — NYSTATIN 100000 UNIT/GM EX OINT: 1.0000 | TOPICAL_OINTMENT | Freq: Two times a day (BID) | CUTANEOUS | 0 refills | Status: AC

## 2024-09-13 NOTE — ED Triage Notes (Signed)
 Rash on right  breast areola that is itching and reportedly leaking.  This has been intermittent for the last 2 years.    Patient wants to be checked for sti, BV, uti, and pregnancy
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
## 2024-09-13 NOTE — Discharge Instructions (Addendum)
 The pregnancy test is negative.  Urinalysis was negative  Take fluconazole  150 mg--1 tablet every 3 days for 3 doses; this is for yeast  Staff will notify you if there is anything positive on the swab (the swab tests for gonorrhea, chlamydia, trichomonas, bacterial vaginosis, and vaginal yeast) or on the blood work. It can take 2-3 days for the tests to result, depending on the day of the week your test was taken. You will only be notified if there are any positives on the testing; test results will also go to your MyChart if you are signed up for MyChart.    Follow-up with your usual doctors about the rash

## 2024-09-14 ENCOUNTER — Ambulatory Visit (HOSPITAL_COMMUNITY): Payer: Self-pay

## 2024-09-14 LAB — CERVICOVAGINAL ANCILLARY ONLY
Bacterial Vaginitis (gardnerella): POSITIVE — AB
Candida Glabrata: NEGATIVE
Candida Vaginitis: NEGATIVE
Chlamydia: NEGATIVE
Comment: NEGATIVE
Comment: NEGATIVE
Comment: NEGATIVE
Comment: NEGATIVE
Comment: NEGATIVE
Comment: NORMAL
Neisseria Gonorrhea: NEGATIVE
Trichomonas: NEGATIVE

## 2024-09-14 LAB — HIV ANTIBODY (ROUTINE TESTING W REFLEX): HIV Screen 4th Generation wRfx: NONREACTIVE

## 2024-09-14 LAB — SYPHILIS: RPR W/REFLEX TO RPR TITER AND TREPONEMAL ANTIBODIES, TRADITIONAL SCREENING AND DIAGNOSIS ALGORITHM: RPR Ser Ql: NONREACTIVE

## 2024-09-14 MED ORDER — METRONIDAZOLE 500 MG PO TABS: 500.0000 mg | ORAL_TABLET | Freq: Two times a day (BID) | ORAL | 0 refills | Status: AC
